# Patient Record
Sex: Male | Born: 1990 | Race: Black or African American | Hispanic: No | Marital: Single | Smoking: Current every day smoker
Health system: Southern US, Community
[De-identification: ages and names within clinical notes are randomized; demographics above are authoritative.]

## PROBLEM LIST (undated history)

## (undated) DIAGNOSIS — I82409 Acute embolism and thrombosis of unspecified deep veins of unspecified lower extremity: Secondary | ICD-10-CM

## (undated) DIAGNOSIS — D6859 Other primary thrombophilia: Secondary | ICD-10-CM

## (undated) HISTORY — PX: FRACTURE SURGERY: SHX138

## (undated) HISTORY — DX: Acute embolism and thrombosis of unspecified deep veins of unspecified lower extremity: I82.409

## (undated) HISTORY — PX: IVC FILTER INSERTION: CATH118245

## (undated) HISTORY — DX: Other primary thrombophilia: D68.59

## (undated) HISTORY — PX: IVC FILTER PLACEMENT (ARMC HX): HXRAD1551

---
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
## 2010-04-25 ENCOUNTER — Ambulatory Visit: Payer: Self-pay | Admitting: Family Medicine

## 2013-08-14 ENCOUNTER — Inpatient Hospital Stay: Payer: Self-pay | Admitting: Family Medicine

## 2013-08-14 LAB — CBC
HCT: 45.5 % (ref 40.0–52.0)
HGB: 14.4 g/dL (ref 13.0–18.0)
MCH: 22.9 pg — ABNORMAL LOW (ref 26.0–34.0)
MCHC: 31.7 g/dL — AB (ref 32.0–36.0)
MCV: 72 fL — ABNORMAL LOW (ref 80–100)
PLATELETS: 214 10*3/uL (ref 150–440)
RBC: 6.3 10*6/uL — AB (ref 4.40–5.90)
RDW: 13.6 % (ref 11.5–14.5)
WBC: 10.8 10*3/uL — ABNORMAL HIGH (ref 3.8–10.6)

## 2013-08-14 LAB — COMPREHENSIVE METABOLIC PANEL
ALBUMIN: 3.7 g/dL (ref 3.4–5.0)
ANION GAP: 3 — AB (ref 7–16)
Alkaline Phosphatase: 93 U/L
BUN: 9 mg/dL (ref 7–18)
Bilirubin,Total: 0.8 mg/dL (ref 0.2–1.0)
CREATININE: 0.81 mg/dL (ref 0.60–1.30)
Calcium, Total: 9.4 mg/dL (ref 8.5–10.1)
Chloride: 106 mmol/L (ref 98–107)
Co2: 27 mmol/L (ref 21–32)
GLUCOSE: 82 mg/dL (ref 65–99)
Osmolality: 270 (ref 275–301)
POTASSIUM: 5 mmol/L (ref 3.5–5.1)
SGOT(AST): 41 U/L — ABNORMAL HIGH (ref 15–37)
SGPT (ALT): 22 U/L (ref 12–78)
SODIUM: 136 mmol/L (ref 136–145)
TOTAL PROTEIN: 7.8 g/dL (ref 6.4–8.2)

## 2013-08-14 LAB — PROTIME-INR
INR: 1
Prothrombin Time: 13.2 secs (ref 11.5–14.7)

## 2013-08-14 LAB — APTT: Activated PTT: 32.6 secs (ref 23.6–35.9)

## 2013-08-15 LAB — APTT
ACTIVATED PTT: 79.8 s — AB (ref 23.6–35.9)
ACTIVATED PTT: 91 s — AB (ref 23.6–35.9)

## 2013-09-15 ENCOUNTER — Ambulatory Visit: Payer: Self-pay | Admitting: Hematology and Oncology

## 2013-11-20 ENCOUNTER — Ambulatory Visit: Payer: Self-pay | Admitting: Hematology and Oncology

## 2013-11-20 LAB — COMPREHENSIVE METABOLIC PANEL
ALT: 19 U/L
ANION GAP: 9 (ref 7–16)
Albumin: 3.9 g/dL (ref 3.4–5.0)
Alkaline Phosphatase: 87 U/L
BILIRUBIN TOTAL: 0.9 mg/dL (ref 0.2–1.0)
BUN: 11 mg/dL (ref 7–18)
CALCIUM: 9.4 mg/dL (ref 8.5–10.1)
CHLORIDE: 103 mmol/L (ref 98–107)
CO2: 29 mmol/L (ref 21–32)
CREATININE: 1.17 mg/dL (ref 0.60–1.30)
EGFR (African American): >60
EGFR (Non-African Amer.): >60
Glucose: 134 mg/dL — ABNORMAL HIGH (ref 65–99)
OSMOLALITY: 283 (ref 275–301)
POTASSIUM: 4 mmol/L (ref 3.5–5.1)
SGOT(AST): 15 U/L (ref 15–37)
SODIUM: 141 mmol/L (ref 136–145)
TOTAL PROTEIN: 7.6 g/dL (ref 6.4–8.2)

## 2013-11-20 LAB — CBC CANCER CENTER
Basophil #: 0 x10 3/mm (ref 0.0–0.1)
Basophil %: 0.3 %
EOS PCT: 1.9 %
Eosinophil #: 0.2 x10 3/mm (ref 0.0–0.7)
HCT: 47.5 % (ref 40.0–52.0)
HGB: 14.6 g/dL (ref 13.0–18.0)
LYMPHS ABS: 1.8 x10 3/mm (ref 1.0–3.6)
Lymphocyte %: 19.5 %
MCH: 22.6 pg — ABNORMAL LOW (ref 26.0–34.0)
MCHC: 30.7 g/dL — ABNORMAL LOW (ref 32.0–36.0)
MCV: 74 fL — AB (ref 80–100)
Monocyte #: 0.6 x10 3/mm (ref 0.2–1.0)
Monocyte %: 6.3 %
Neutrophil #: 6.7 x10 3/mm — ABNORMAL HIGH (ref 1.4–6.5)
Neutrophil %: 72 %
PLATELETS: 216 x10 3/mm (ref 150–440)
RBC: 6.45 10*6/uL — AB (ref 4.40–5.90)
RDW: 15.7 % — AB (ref 11.5–14.5)
WBC: 9.4 x10 3/mm (ref 3.8–10.6)

## 2013-11-25 ENCOUNTER — Ambulatory Visit: Payer: Self-pay | Admitting: Hematology and Oncology

## 2014-02-24 ENCOUNTER — Ambulatory Visit: Payer: Self-pay | Admitting: Hematology and Oncology

## 2014-03-27 ENCOUNTER — Ambulatory Visit: Payer: Self-pay | Admitting: Hematology and Oncology

## 2014-07-18 NOTE — H&P (Signed)
PATIENT NAME:  Jared David, Jared David MR#:  [id number] DATE OF BIRTH:  1990-11-18  DATE OF ADMISSION:  08/14/2013  PRIMARY CARE PHYSICIAN: None.   CHIEF COMPLAINT: Left leg swelling and pain for 3 to 4 days.   HOSPITAL COURSE: Mr. Lerew is a 24 year old African American gentleman with no past medical history who comes into the Emergency Room after he had complaints of increasing pain and swelling on the left lower extremity, swelling and pain. The patient came to the Emergency Room and was found to have acute large DVT starting from the femoral vein down to the calf veins. There is an area in the femoral vein where there is substantial thrombus which appears to be floating in the middle and distal portions of this vein. The patient is going to be started on IV heparin drip and is being admitted for further evaluation and management. The patient denies any recent illness. He is otherwise very active. Denies any other injury other than left ankle crush injury several years ago and had metal rods put in at that time. The patient is being admitted for further evaluation and management.   PAST MEDICAL HISTORY: None.   PAST SURGICAL HISTORY: Left ankle crush injury status post metal rod placement several years ago.   ALLERGIES: No known drug allergies.   FAMILY HISTORY: Positive for cancer in one of his aunts. The patient does not know what kind.   SOCIAL HISTORY: Smokes about 2 packs every week and drinks 3 to 4 times a week.  REVIEW OF SYSTEMS:  CONSTITUTIONAL: No fever, fatigue, weakness.  EYES: No blurred or double vision.  ENT: No tinnitus, ear pain, hearing loss. RESPIRATORY: No cough, wheeze, hemoptysis or COPD. CARDIOVASCULAR: No chest pain, orthopnea, edema or dyspnea on exertion. GASTROINTESTINAL: No nausea, vomiting, diarrhea, abdominal pain, or GERD.  GENITOURINARY: No dysuria, hematuria, or frequency.  ENDOCRINE: No polyuria, nocturia or thyroid problems.  HEMATOLOGY: No anemia, easy  bruising or bleeding.  SKIN: No acne or rash.  MUSCULOSKELETAL: Positive for left ankle pain.  NEUROLOGIC: No CVA, TIA, ataxia or dementia. ANXIETY: No anxiety or depression. All other systems reviewed and negative.   PHYSICAL EXAMINATION: GENERAL: The patient is awake, alert, and oriented x3, not in acute distress.  VITAL SIGNS: Afebrile. Pulse is 76. Blood pressure is 140/86 and sats are 94% on room air.  HEENT: Atraumatic, normocephalic. Pupils are equal, round and reactive to light and accommodation. EOM intact. Oral mucosa is moist.  NECK: Supple. No JVD. No carotid bruit.  LUNGS: Clear to auscultation bilaterally. No rales, rhonchi, respiratory distress or labored breathing.  CARDIOVASCULAR: Both heart sounds are normal. Rate and rhythm regular. PMI not lateralized. Chest nontender.  EXTREMITIES: Left lower extremity swelling, tenderness present at the calf. Good femoral pulses. Good pedal pulses. Trace lower extremity edema on the left. NEUROLOGIC: Grossly intact cranial nerves II through XII. No motor or sensory deficit. PSYCHIATRIC: The patient is awake, alert, and oriented x3.  SKIN: Warm and dry.   DIAGNOSTIC DATA: Basic metabolic panel within normal limits.   Ultrasound of the lower extremity shows large acute DVT involving the left gastrocnemius, peroneal, posterior tibial, popliteal and superficial femoral veins. It is concerning that the thrombus within the middle and distal portion of the left superficial femoral vein appears to be pedunculated and floating within the lumen of the vein, concerning for risk of breaking off and moving centrally.   PT-INR within normal limits. H and H 14.4 and 45.5.   ASSESSMENT AND  PLAN: A 24 year old, Jared David, with no significant past medical history who comes in with:  1.  Acute large deep venous thrombosis, left lower extremity with mobile thrombus in the middle distal part of the left superficial femoral vein. The patient is going to  be started on heparin drip after bolus. The case was discussed by ER physician with Dr. Gilda CreaseSchnier who plans on placing an IVC filter. Hypercoagulable panel has been sent out. We will consult oncology for acute deep vein thrombosis. The patient, once stable, can be switched to oral anticoagulation.  2.  Left leg pain. Treatment as above. P.r.n. pain medication.  3.  Deep vein thrombosis prophylaxis. The patient is already on IV heparin drip.  4.  Tobacco abuse. The patient was advised on smoking cessation, about 4 minutes spent. He did voice understanding.   Further work-up according to the patient's clinical course. Hospital admission plan was discussed with the patient.   TIME SPENT: 50 minutes.   ____________________________ Wylie HailSona A. Allena KatzPatel, MD sap:sb D: 08/14/2013 13:30:17 ET T: 08/14/2013 14:20:06 ET JOB#: [id number]  cc: Coston Mandato A. Allena KatzPatel, MD, <Dictator> Willow OraSONA A Gergory Biello MD ELECTRONICALLY SIGNED 08/17/2013 16:25

## 2014-07-18 NOTE — Consult Note (Signed)
Brief Consult Note: Diagnosis: LLE DVT s/p IVC filter placement 08/14/13.   Comments: Idiopathic DVT LLE- hypercoaguable w/u pending. Will need oral anticoagulation for 6 months total if no risk factor identified. S/p IVC filter. Full consult to follow.  Electronic Signatures: Antony Hasteamiah, Mishika Flippen S (MD)  (Signed 22-May-15 12:41)  Authored: Brief Consult Note   Last Updated: 22-May-15 12:41 by Antony Hasteamiah, Trae Bovenzi S (MD)

## 2014-07-18 NOTE — Op Note (Signed)
PATIENT NAME:  Acey LavWOODS, Z[place] C MR#:  [id number] DATE OF BIRTH:  1990/06/13  DATE OF PROCEDURE:  08/14/2013  PREOPERATIVE DIAGNOSIS: Deep venous thrombosis left lower extremity with mobile thrombus by duplex ultrasound.   POSTOPERATIVE DIAGNOSIS: Deep venous thrombosis left lower extremity with mobile thrombus by duplex ultrasound.   PROCEDURES PERFORMED: 1. Inferior venacavogram.  2. Placement of infrarenal inferior vena caval filter, Denali type.   SURGEON: Renford DillsGregory G. Hideko Esselman, MD   SEDATION: Versed 3 mg.   ACCESS: A 9-French sheath, right common femoral vein.   FLUOROSCOPY TIME: Less than one minute.   CONTRAST USED: Isovue 15 mL.   INDICATIONS: This patient is a 24 year old gentleman who presented to the Emergency Room with a very large painful, swollen left leg. Duplex ultrasound demonstrated an extensive mobile tail sitting within the femoral vein and it is elected to place an IVC filter in conjunction with his anticoagulation to prevent pulmonary embolism. Risks and benefits were reviewed. The patient has agreed to proceed. Discussion regarding filter removal and the indications and importance of filter removal were also reviewed at this time and the patient voiced understanding of this.   DESCRIPTION OF PROCEDURE: The patient is taken to special procedures and placed in the supine position. After adequate sedation has been achieved, the right groin is prepped and draped in a sterile fashion. Ultrasound is placed in a sterile sleeve.   Femoral vein is echolucent and compressible indicating patency. Image is recorded for the permanent record. Under real-time visualization after 1% lidocaine has been infiltrated;  micropuncture needle is used to access the femoral vein. Microwire followed by microsheath, J-wire followed by the 9.5 French delivery sheath. Delivery sheath is advanced under fluoroscopy to the level of the iliac confluence. AP projection of the vena cava is then obtained with  a bolus injection of contrast. After review of the images, the wire is reintroduced and the sheath is advanced so that the tip is at the L2 level. Denali filter is then deployed without difficulty. The sheath is pulled, pressure is held, and a safeguard device is placed. There are no immediate complications.   INTERPRETATION: Imaging of the cava demonstrates the cava is free of filling defects. Renal veins are localized actually a small amount of contrast is refluxed into the right and left renal veins at the L1 level. Vena cava itself measures 24.5 mm in diameter.   [place] Walk[place]ali filter is deployed at the L2 level without difficulty.   SUMMARY: Successful placement of infrarenal filter.   ____________________________ Renford DillsGregory G. Mouna Yager, MD ggs:dd D: 08/14/2013 18:00:06 ET T: 08/14/2013 21:25:59 ET JOB#: [id number]  cc: Renford DillsGregory G. Gusta Marksberry, MD, <Dictator> Renford DillsGREGORY G Emelin Dascenzo MD ELECTRONICALLY SIGNED 08/26/2013 12:28

## 2014-07-18 NOTE — Consult Note (Signed)
Present Illness The patient is a 24 year old gentleman who comes into the Emergency Room the complaint of increasing pain and swelling on the left lower extremity, swelling and pain. The patient was found to have acute large DVT starting from the femoral vein down to the calf veins. There is an area in the femoral vein where there is substantial thrombus which appears to be floating in the middle and distal portions of this vein. The patient was be started on IV heparin drip and is admitted for further evaluation and management. The patient denies any recent illness. He is otherwise very active. Denies any other injury other than left ankle crush injury several years ago and had metal rods put in at that time.   PAST MEDICAL HISTORY: None.   PAST SURGICAL HISTORY: Left ankle crush injury status post metal rod placement several years ago.   Home Medications: Medication Status  none Active    No Known Allergies:   Case History:  Family History Non-Contributory   Social History positive  tobacco, positive ETOH, negative Illicit drugs   Review of Systems:  Fever/Chills No   Cough No   Sputum No   Abdominal Pain No   Diarrhea No   Constipation No   Nausea/Vomiting No   SOB/DOE No   Chest Pain No   Telemetry Reviewed NSR   Dysuria No   Physical Exam:  GEN well developed, well nourished, no acute distress   HEENT PERRL, hearing intact to voice, moist oral mucosa   NECK supple  trachea midline   RESP normal resp effort  no use of accessory muscles   CARD regular rate  no JVD   ABD denies tenderness  soft   EXTR negative cyanosis/clubbing, positive edema   SKIN No rashes, No ulcers   NEURO cranial nerves intact, follows commands, motor/sensory function intact   PSYCH alert, A+O to time, place, person   Nursing/Ancillary Notes: **Vital Signs.:   21-May-15 14:30  Temperature Temperature (F) 98.2  Celsius 36.7  Temperature Source oral  Pulse Pulse 55   Respirations Respirations 16  Systolic BP Systolic BP 809  Diastolic BP (mmHg) Diastolic BP (mmHg) 77  Mean BP 93  Pulse Ox % Pulse Ox % 96  Pulse Ox Activity Level  At rest  Oxygen Delivery Room Air/ 21 %    15:10  Pulse Pulse 52  Respirations Respirations 18  Systolic BP Systolic BP 983  Diastolic BP (mmHg) Diastolic BP (mmHg) 63  Pulse Ox % Pulse Ox % 97   LabObservation:  21-May-15 12:27   OBSERVATION Reason for Test Pain  Hepatic:  21-May-15 12:31   Bilirubin, Total 0.8  Alkaline Phosphatase 93 (45-117 NOTE: New Reference Range 02/14/13)  SGPT (ALT) 22  SGOT (AST)  41  Total Protein, Serum 7.8  Albumin, Serum 3.7  Routine Chem:  21-May-15 12:31   Glucose, Serum 82  BUN 9  Creatinine (comp) 0.81  Sodium, Serum 136  Potassium, Serum 5.0  Chloride, Serum 106  CO2, Serum 27  Calcium (Total), Serum 9.4  Osmolality (calc) 270  eGFR (African American) >60  eGFR (Non-African American) >60 (eGFR values <63m/min/1.73 m2 may be an indication of chronic kidney disease (CKD). Calculated eGFR is useful in patients with stable renal function. The eGFR calculation will not be reliable in acutely ill patients when serum creatinine is changing rapidly. It is not useful in  patients on dialysis. The eGFR calculation may not be applicable to patients at the low and high  extremes of body sizes, pregnant women, and vegetarians.)  Anion Gap  3  Routine Coag:  21-May-15 12:31   Prothrombin 13.2  INR 1.0 (INR reference interval applies to patients on anticoagulant therapy. A single INR therapeutic range for coumarins is not optimal for all indications; however, the suggested range for most indications is 2.0 - 3.0. Exceptions to the INR Reference Range may include: Prosthetic heart valves, acute myocardial infarction, prevention of myocardial infarction, and combinations of aspirin and anticoagulant. The need for a higher or lower target INR must be assessed  individually. Reference: The Pharmacology and Management of the Vitamin K  antagonists: the seventh ACCP Conference on Antithrombotic and Thrombolytic Therapy. WTUUE.2800 Sept:126 (3suppl): N9146842. A HCT value >55% may artifactually increase the PT.  In one study,  the increase was an average of 25%. Reference:  "Effect on Routine and Special Coagulation Testing Values of Citrate Anticoagulant Adjustment in Patients with High HCT Values." American Journal of Clinical Pathology 2006;126:400-405.)  Activated PTT (APTT) 32.6 (A HCT value >55% may artifactually increase the APTT. In one study, the increase was an average of 19%. Reference: "Effect on Routine and Special Coagulation Testing Values of Citrate Anticoagulant Adjustment in Patients with High HCT Values." American Journal of Clinical Pathology 2006;126:400-405.)  Routine Hem:  21-May-15 12:31   WBC (CBC)  10.8  RBC (CBC)  6.30  Hemoglobin (CBC) 14.4  Hematocrit (CBC) 45.5  MCV  72  MCH  22.9  MCHC  31.7  RDW 13.6   [place]:    21-May-15 12:27, [place] Color Flow Doppler Low Extrem Left (Leg)  [place] Color Flow Doppler Low Extrem Left (Leg)   REASON FOR EXAM:    Pain  COMMENTS:   LMP: (Male)    PROCEDURE: [place]  - US DOPPLER LOW EXTR LEFT  - Aug 14 2013 12:27PM     CLINICAL DATA:  Left lower extremity pain and swelling.    EXAM:  Left LOWER EXTREMITY VENOUS DOPPLER ULTRASOUND    TECHNIQUE:  Gray-scale sonography with graded compression, as well as color  Doppler and duplex ultrasound were performed to evaluate the lower  extremity deep venous systems from the level of the common femoral  vein and including the common femoral, femoral, profunda femoral,  popliteal and calf veins including the posterior tibial, peroneal  and gastrocnemius veins when visible. The superficial great  saphenous vein was also interrogated. Spectral Doppler was utilized  to evaluate flow at rest and with distal augmentation maneuvers in  the common  femoral, femoral and popliteal veins.    COMPARISON:  None.    FINDINGS:  Common Femoral Vein: No evidence of thrombus. Normal compressibility  and respiratory phasicity. Augmentation was not performed.    Saphenofemoral Junction: No evidence of thrombus. Normal  compressibility and flow on color Doppler imaging.  Profunda Femoral Vein: No evidence of thrombus. Normal  compressibility and flow on color Doppler imaging.    Femoral Vein: There is a substantial thrombus which appears to be  floating within the middle and distal portions of this vein.    Popliteal Vein: Acute thrombosis is noted without compressibility or  phasicity.    Calf Veins: Acute thrombosis is noted in posterior tibial and  peroneal veins without compressibility or phases to the.    Superficial Great Saphenous Vein: No evidence of thrombus. Normal  compressibility and flow on color Doppler imaging.  Venous Reflux:  None.    Other Findings:  Thrombus is noted within the gastrocnemius vein.  IMPRESSION:  Large acute deep venous thrombosis seen involving the left  gastrocnemius, peroneal, posterior tibial, popliteal and superficial  femoral veins. It is concerning that the thrombus within the middle  and distal portion of the left superficial femoral vein appears to  be pedunculated and floating within the lumen of the vein,  concerning for increased risk of breaking off and moving centrally.  Critical Value/emergent results were called by telephone at the time  of interpretation on 08/14/2013 at 12:45 PM to Dr. Allena Earing, who  verbally acknowledged these results.  Electronically Signed    By: Sabino Dick M.D.    On: 08/14/2013 12:46         Verified By: Marveen Reeks, M.D.,    Impression 1.  DVT left leg  given the mobile portion of the thrombus he should be started on heparin adn I also recommend that a filter be placed to prevent leathal PE.  Anticoagulation will be continued for 6-8  months I have discussed with the patient that the filter should be removed in about 4 months and he voices understanding.  The risks and benefits were discussed all questions were answered alternatives were reviewed.  The patietn agrees to proceed with filter placement. 2.  Left leg pain. Treatment as above. P.r.n. pain medication.  3.  Tobacco abuse. The patient was advised on smoking cessation, about 4 minutes spent. He did voice understanding.   Plan level 3 consult   Electronic Signatures: Hortencia Pilar (MD)  (Signed 21-May-15 17:35)  Authored: General Aspect/Present Illness, Home Medications, Allergies, History and Physical Exam, Vital Signs, Labs, Radiology, Impression/Plan   Last Updated: 21-May-15 17:35 by Hortencia Pilar (MD)

## 2014-07-18 NOTE — Discharge Summary (Signed)
PATIENT NAME:  Jared David, Egbert C MR#:  [id number] DATE OF BIRTH:  1990-12-22  DATE OF ADMISSION:  08/14/2013 DATE OF DISCHARGE:  08/15/2013  PROCEDURES:  1. IVC filter placement due to deep vein thrombosis of the left lower extremity with mobile thrombosis by duplex ultrasound. Surgeon: Dr. Levora DredgeGregory Schnier.  2. Denali filter deployed at the level of L2 without any difficulty.   Filter okay to be removed in 4 months.   CONSULTANTS: Dr. Shyrl NumbersVeshana Ramiah, Dr. Levora DredgeGregory Schnier.    DISPOSITION: Home.   MEDICATION AT DISCHARGE: Xarelto 15 mg p.o. twice daily for 21 days, then change to 20 mg p.o. once daily for a total of 6 months.   FOLLOWUP:  1. Dr. Shyrl NumbersVeshana Ramiah for hypercoagulable workup results.  2. Follow up Dr. Levora DredgeGregory Schnier for removal of filter in 4 months.   LABORATORY WORK: Glucose 82, BUN 9, creatinine 0.81. Other electrolytes were within normal limits. AST 41. White count is 10.8. Hemoglobin is 14. Platelet count 214. INR 1.0.   TESTS TO BE COMPLETED: Factor II mutation analysis, factor V Leiden mutation, protein C and protein S panel, protein C deficiency profile.   All of this has been sent and will be followed up as outpatient.   RADIOLOGY: Ultrasound of the left lower extremity shows large acute deep vein thrombosis seen involving the left gastrocnemius, peroneal, posterior tibial, popliteal and superficial femoral veins, concerning for thrombus within the middle and distal portion of the left superficial femoral vein. Appears to be pedunculated and floating within the lumen of the vein. Concerning for increased risk of breaking off and moving centrally.   HOSPITAL COURSE: This is a very nice 24 year old gentleman who has a history of being healthy, admitted on 08/14/2013 by Dr. Enedina FinnerSona Patel with a history of left leg swelling and pain for 3 or 4 days. The patient went to the Emergency Department. He was evaluated. Swelling was evident. Ultrasound of the lower extremity was done  showing an acute DVT as mentioned above. The patient was started on IV heparin, and vascular surgeon was called for IVC filter placement. Placement of the IVC filter was done by Dr. Gilda CreaseSchnier successfully without any problems. The patient had previous ankle injury several years ago with metal rods at the time in the left lower extremity, but otherwise he denies any significant risk factors for DVT. The patient is active. Not sedentary. No recent trauma. No known cancer or other problems. He states that his grandfather had similar problems of DVT. The patient was admitted, treated with heparin. Heparin was changed to Xarelto, and the patient was able to be discharged in good condition. He was evaluated by Dr. Wendie Simmeramiah who is going to follow up as outpatient for hypercoagulable workup and Dr. Gilda CreaseSchnier who is going to follow up for removal of IVC filter.   TIME SPENT: I spent about 40 minutes discharging this patient, education about the use of Xarelto and avoidance of trauma, contact sports, heavy machinery, etc. has been given to the patient. The patient is a smoker. Smoking cessation counseling given to the patient for over 4 minutes.   ____________________________ Felipa Furnaceoberto Sanchez Gutierrez, MD rsg:gb D: 08/15/2013 13:40:39 ET T: 08/15/2013 21:53:11 ET JOB#: [id number]  cc: Felipa Furnaceoberto Sanchez Gutierrez, MD, <Dictator> Tasnim Balentine Juanda ChanceSANCHEZ GUTIERRE MD ELECTRONICALLY SIGNED 08/30/2013 10:49

## 2015-05-10 ENCOUNTER — Inpatient Hospital Stay: Payer: Self-pay | Attending: Internal Medicine | Admitting: Internal Medicine

## 2015-05-10 ENCOUNTER — Inpatient Hospital Stay: Payer: Self-pay

## 2015-05-10 ENCOUNTER — Encounter: Payer: Self-pay | Admitting: Internal Medicine

## 2015-05-10 VITALS — BP 127/84 | HR 81 | Temp 96.7°F | Resp 18 | Ht 71.0 in | Wt 156.2 lb

## 2015-05-10 DIAGNOSIS — I82402 Acute embolism and thrombosis of unspecified deep veins of left lower extremity: Secondary | ICD-10-CM

## 2015-05-10 DIAGNOSIS — D6859 Other primary thrombophilia: Secondary | ICD-10-CM | POA: Insufficient documentation

## 2015-05-10 DIAGNOSIS — Z86718 Personal history of other venous thrombosis and embolism: Secondary | ICD-10-CM | POA: Insufficient documentation

## 2015-05-10 DIAGNOSIS — F17210 Nicotine dependence, cigarettes, uncomplicated: Secondary | ICD-10-CM | POA: Insufficient documentation

## 2015-05-10 LAB — FIBRIN DERIVATIVES D-DIMER (ARMC ONLY): FIBRIN DERIVATIVES D-DIMER (ARMC): 331 (ref 0–499)
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
## 2015-05-10 NOTE — Progress Notes (Signed)
Pt reports having blood clot in May 2015 and had a filter placed.  Pt took Xarelto from May 2015 to March 2016.   No bleeding concerns.  Pt reporting that right wrist has pain in it after cutting wood.

## 2015-05-11 LAB — PROTEIN C ACTIVITY: Protein C Activity: 48 % — ABNORMAL LOW (ref 73–180)

## 2015-05-11 LAB — PROTEIN C, TOTAL: Protein C, Total: 43 % — ABNORMAL LOW (ref 60–150)

## 2015-05-13 ENCOUNTER — Ambulatory Visit
Admission: RE | Admit: 2015-05-13 | Discharge: 2015-05-13 | Disposition: A | Discharge status: Home / Self Care | Payer: Self-pay | Source: Ambulatory Visit | Attending: Internal Medicine | Admitting: Internal Medicine

## 2015-05-13 DIAGNOSIS — I82402 Acute embolism and thrombosis of unspecified deep veins of left lower extremity: Secondary | ICD-10-CM | POA: Insufficient documentation

## 2015-05-13 DIAGNOSIS — Z86718 Personal history of other venous thrombosis and embolism: Secondary | ICD-10-CM | POA: Insufficient documentation

## 2015-05-31 ENCOUNTER — Inpatient Hospital Stay: Payer: Self-pay | Attending: Family Medicine | Admitting: Family Medicine

## 2015-05-31 ENCOUNTER — Encounter: Payer: Self-pay | Admitting: Family Medicine

## 2015-05-31 VITALS — BP 144/76 | HR 78 | Temp 96.9°F | Wt 161.0 lb

## 2015-05-31 DIAGNOSIS — F17210 Nicotine dependence, cigarettes, uncomplicated: Secondary | ICD-10-CM | POA: Insufficient documentation

## 2015-05-31 DIAGNOSIS — Z7289 Other problems related to lifestyle: Secondary | ICD-10-CM | POA: Insufficient documentation

## 2015-05-31 DIAGNOSIS — Z86718 Personal history of other venous thrombosis and embolism: Secondary | ICD-10-CM | POA: Insufficient documentation

## 2015-05-31 DIAGNOSIS — D6859 Other primary thrombophilia: Secondary | ICD-10-CM | POA: Insufficient documentation

## 2015-05-31 DIAGNOSIS — I82409 Acute embolism and thrombosis of unspecified deep veins of unspecified lower extremity: Secondary | ICD-10-CM

## 2015-05-31 HISTORY — DX: Acute embolism and thrombosis of unspecified deep veins of unspecified lower extremity: I82.409

## 2015-05-31 HISTORY — DX: Other primary thrombophilia: D68.59
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
## 2015-06-28 ENCOUNTER — Inpatient Hospital Stay (HOSPITAL_BASED_OUTPATIENT_CLINIC_OR_DEPARTMENT_OTHER): Payer: Self-pay | Admitting: Internal Medicine

## 2015-06-28 ENCOUNTER — Inpatient Hospital Stay: Payer: Self-pay | Attending: Internal Medicine

## 2015-06-28 ENCOUNTER — Encounter: Payer: Self-pay | Admitting: Internal Medicine

## 2015-06-28 VITALS — BP 129/73 | HR 65 | Temp 98.0°F | Resp 18 | Wt 169.5 lb

## 2015-06-28 DIAGNOSIS — I82402 Acute embolism and thrombosis of unspecified deep veins of left lower extremity: Secondary | ICD-10-CM

## 2015-06-28 DIAGNOSIS — Z86718 Personal history of other venous thrombosis and embolism: Secondary | ICD-10-CM | POA: Insufficient documentation

## 2015-06-28 DIAGNOSIS — D6859 Other primary thrombophilia: Secondary | ICD-10-CM

## 2015-06-28 DIAGNOSIS — F17210 Nicotine dependence, cigarettes, uncomplicated: Secondary | ICD-10-CM | POA: Insufficient documentation

## 2015-06-28 LAB — PROTIME-INR
INR: 1.03
Prothrombin Time: 13.7 seconds (ref 11.4–15.0)

## 2015-06-28 NOTE — Patient Instructions (Signed)
Smoking Cessation, Tips for Success If you are ready to quit smoking, congratulations! You have chosen to help yourself be healthier. Cigarettes bring nicotine, tar, carbon monoxide, and other irritants into your body. Your lungs, heart, and blood vessels will be able to work better without these poisons. There are many different ways to quit smoking. Nicotine gum, nicotine patches, a nicotine inhaler, or nicotine nasal spray can help with physical craving. Hypnosis, support groups, and medicines help break the habit of smoking. WHAT THINGS CAN I DO TO MAKE QUITTING EASIER?  Here are some tips to help you quit for good:  Pick a date when you will quit smoking completely. Tell all of your friends and family about your plan to quit on that date.  Do not try to slowly cut down on the number of cigarettes you are smoking. Pick a quit date and quit smoking completely starting on that day.  Throw away all cigarettes.   Clean and remove all ashtrays from your home, work, and car.  On a card, write down your reasons for quitting. Carry the card with you and read it when you get the urge to smoke.  Cleanse your body of nicotine. Drink enough water and fluids to keep your urine clear or pale yellow. Do this after quitting to flush the nicotine from your body.  Learn to predict your moods. Do not let a bad situation be your excuse to have a cigarette. Some situations in your life might tempt you into wanting a cigarette.  Never have "just one" cigarette. It leads to wanting another and another. Remind yourself of your decision to quit.  Change habits associated with smoking. If you smoked while driving or when feeling stressed, try other activities to replace smoking. Stand up when drinking your coffee. Brush your teeth after eating. Sit in a different chair when you read the paper. Avoid alcohol while trying to quit, and try to drink fewer caffeinated beverages. Alcohol and caffeine may urge you to  smoke.  Avoid foods and drinks that can trigger a desire to smoke, such as sugary or spicy foods and alcohol.  Ask people who smoke not to smoke around you.  Have something planned to do right after eating or having a cup of coffee. For example, plan to take a walk or exercise.  Try a relaxation exercise to calm you down and decrease your stress. Remember, you may be tense and nervous for the first 2 weeks after you quit, but this will pass.  Find new activities to keep your hands busy. Play with a pen, coin, or rubber band. Doodle or draw things on paper.  Brush your teeth right after eating. This will help cut down on the craving for the taste of tobacco after meals. You can also try mouthwash.   Use oral substitutes in place of cigarettes. Try using lemon drops, carrots, cinnamon sticks, or chewing gum. Keep them handy so they are available when you have the urge to smoke.  When you have the urge to smoke, try deep breathing.  Designate your home as a nonsmoking area.  If you are a heavy smoker, ask your health care provider about a prescription for nicotine chewing gum. It can ease your withdrawal from nicotine.  Reward yourself. Set aside the cigarette money you save and buy yourself something nice.  Look for support from others. Join a support group or smoking cessation program. Ask someone at home or at work to help you with your plan   to quit smoking.  Always ask yourself, "Do I need this cigarette or is this just a reflex?" Tell yourself, "Today, I choose not to smoke," or "I do not want to smoke." You are reminding yourself of your decision to quit.  Do not replace cigarette smoking with electronic cigarettes (commonly called e-cigarettes). The safety of e-cigarettes is unknown, and some may contain harmful chemicals.  If you relapse, do not give up! Plan ahead and think about what you will do the next time you get the urge to smoke. HOW WILL I FEEL WHEN I QUIT SMOKING? You  may have symptoms of withdrawal because your body is used to nicotine (the addictive substance in cigarettes). You may crave cigarettes, be irritable, feel very hungry, cough often, get headaches, or have difficulty concentrating. The withdrawal symptoms are only temporary. They are strongest when you first quit but will go away within 10-14 days. When withdrawal symptoms occur, stay in control. Think about your reasons for quitting. Remind yourself that these are signs that your body is healing and getting used to being without cigarettes. Remember that withdrawal symptoms are easier to treat than the major diseases that smoking can cause.  Even after the withdrawal is over, expect periodic urges to smoke. However, these cravings are generally short lived and will go away whether you smoke or not. Do not smoke! WHAT RESOURCES ARE AVAILABLE TO HELP ME QUIT SMOKING? Your health care provider can direct you to community resources or hospitals for support, which may include:  Group support.  Education.  Hypnosis.  Therapy.   This information is not intended to replace advice given to you by your health care provider. Make sure you discuss any questions you have with your health care provider.   Document Released: 12/10/2003 Document Revised: 04/03/2014 Document Reviewed: 08/29/2012 Elsevier Interactive Patient Education 2016 [employer]  

## 2015-06-28 NOTE — Progress Notes (Signed)
[facility] OFFICE PROGRESS NOTE  Patient Care Team: Alba CoryKrichna Sowles, MD as PCP - General (Family Medicine) No Pcp Per Patient (General Practice)   SUMMARY OF HEMATOLOGIC HISTORY:  # MAY 2015- DVT of Left LE; s/p IVC filter [Mobile Blood clot]; on anticoag x1 year; stopped sec to incarceration. 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 I reviewed the patient's prior charts/pertinent labs; findings are summarized above.   25 year old African-American male patient with a history of DVT - is currently here for follow-up/regarding recommendations of anti- coagulation. Patient denies any new blood clots. Denies any unusual shortness of breath or cough.  He smokes.    REVIEW O .F SYSTEMS:  A complete 10 point review of system is done which is negative except mentioned above/history of present illness.   PAST MEDICAL HISTORY :  Past Medical History  Diagnosis Date  . DVT (deep venous thrombosis) (HCC)   . 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 DVT (deep venous thrombosis) (HCC) 05/31/2015    PAST SURGICAL HISTORY :   Past Surgical History  Procedure Laterality Date  . Ivc filter placement ([facility] hx)      FAMILY HISTORY :  No family history on file.grandfather had ? Blood clots.   SOCIAL HISTORY:   Social History  Substance Use Topics  . Smoking status: Current Every Day Smoker -- 0.25 packs/day for 6 years    Types: Cigarettes  . Smokeless tobacco: Never Used  . Alcohol Use: 6.0 oz/week    10 Shots of liquor per week    ALLERGIES:  has No Known Allergies.  MEDICATIONS:  No current outpatient prescriptions on file.   No current facility-administered medications for this visit.    PHYSICAL EXAMINATION:   BP 129/73 mmHg  Pulse 65  Temp(Src) 98 F (36.7 C) (Tympanic)  Resp 18  Wt 169 lb 8.5  oz (76.9 kg)  Filed Weights   06/28/15 1527  Weight: 169 lb 8.5 oz (76.9 kg)    GENERAL: Well-nourished well-developed; Alert, no distress and comfortable.   Alone. Tattoos EYES: no pallor or icterus OROPHARYNX: no thrush or ulceration; good dentition  NECK: supple, no masses felt LYMPH:  no palpable lymphadenopathy in the cervical, axillary or inguinal regions LUNGS: clear to auscultation and  No wheeze or crackles HEART/CVS: regular rate & rhythm and no murmurs; No lower extremity edema ABDOMEN:abdomen soft, non-tender and normal bowel sounds Musculoskeletal:no cyanosis of digits and no clubbing  PSYCH: alert & oriented x 3 with fluent speech; flat affect NEURO: no focal motor/sensory deficits SKIN:  no rashes or significant lesions  LABORATORY DATA:  I have reviewed the data as listed    Component Value Date/Time   NA 141 11/20/2013 1051   K 4.0 11/20/2013 1051   CL 103 11/20/2013 1051   CO2 29 11/20/2013 1051   GLUCOSE 134* 11/20/2013 1051   BUN 11 11/20/2013 1051   CREATININE 1.17 11/20/2013 1051   CALCIUM 9.4 11/20/2013 1051   PROT 7.6 11/20/2013 1051   ALBUMIN 3.9 11/20/2013 1051   AST 15 11/20/2013 1051   ALT 19 11/20/2013 1051   ALKPHOS 87 11/20/2013 1051   BILITOT 0.9 11/20/2013 1051   GFRNONAA >60 11/20/2013 1051   GFRAA >60 11/20/2013 1051    No results found for: SPEP, UPEP  Lab Results  Component Value Date   WBC 9.4 11/20/2013   NEUTROABS 6.7* 11/20/2013   HGB 14.6 11/20/2013   HCT 47.5 11/20/2013   MCV 74* 11/20/2013   PLT 216 11/20/2013      Chemistry      Component Value Date/Time   NA 141 11/20/2013 1051   K 4.0 11/20/2013 1051   CL 103 11/20/2013 1051   CO2 29 11/20/2013 1051   BUN 11 11/20/2013 1051   CREATININE 1.17 11/20/2013 1051      Component Value Date/Time   CALCIUM 9.4 11/20/2013 1051   ALKPHOS 87 11/20/2013 1051   AST 15 11/20/2013 1051   ALT 19 11/20/2013 1051   BILITOT 0.9 11/20/2013 1051        ASSESSMENT &  PLAN:   # Protein C deficiency; question provoked DVT of the left lower extremity. Recommend quitting smoking. I would recommend aspirin 325 mg a day. Discussed the risk of repeated blood clots with protein C deficiency- however committing this patient to long-term anticoagulation Is also putting the patient at risk for bleeding tendencies. I think given on previous question provoked DVT- I would reserve lifelong anticoagulation for repeated blood clot  # I reviewed the general precautions regarding DVT/PE prophylaxis.  # Retrieval of the IVC filter- and ideal situation the IVC filter should be taken out if the patient is on anticoagulation. However, I'm concerned about compliance/ and the fact that we're holding anticoagulation at this time I recommend keeping the filter in for now.   # Patient could follow up in 6 months with the nurse practitioner.  # 15 minutes face-to-face with the patient discussing the above plan of care; more than 50% of time spent on natural history; counseling and coordination.       Earna Coder, MD 06/28/2015 3:41 PM

## 2015-09-25 ENCOUNTER — Ambulatory Visit (INDEPENDENT_AMBULATORY_CARE_PROVIDER_SITE_OTHER): Payer: Self-pay

## 2015-09-25 ENCOUNTER — Encounter: Payer: Self-pay | Admitting: Gynecology

## 2015-09-25 ENCOUNTER — Ambulatory Visit
Admission: EM | Admit: 2015-09-25 | Discharge: 2015-09-25 | Disposition: A | Discharge status: Home / Self Care | Payer: Self-pay | Attending: Family Medicine | Admitting: Family Medicine

## 2015-09-25 DIAGNOSIS — S0083XA Contusion of other part of head, initial encounter: Secondary | ICD-10-CM

## 2015-09-25 DIAGNOSIS — S60012A Contusion of left thumb without damage to nail, initial encounter: Secondary | ICD-10-CM

## 2015-09-25 DIAGNOSIS — S63602A Unspecified sprain of left thumb, initial encounter: Secondary | ICD-10-CM

## 2015-09-25 MED ORDER — ACETAMINOPHEN 500 MG PO TABS: 1000.0000 mg | ORAL_TABLET | Freq: Once | ORAL | Status: DC

## 2015-09-25 MED ORDER — HYDROCODONE-ACETAMINOPHEN 5-325 MG PO TABS: ORAL_TABLET | ORAL | Status: DC

## 2015-09-25 MED ORDER — ACETAMINOPHEN 500 MG PO TABS
1000.0000 mg | ORAL_TABLET | Freq: Four times a day (QID) | ORAL | Status: DC | PRN
  Administered 2015-09-25: 1000 mg via ORAL

## 2015-09-25 NOTE — Discharge Instructions (Signed)
Facial or Scalp Contusion A facial or scalp contusion is a deep bruise on the face or head. Injuries to the face and head generally cause a lot of swelling, especially around the eyes. Contusions are the result of an injury that caused bleeding under the skin. The contusion may turn blue, purple, or yellow. Minor injuries will give you a painless contusion, but more severe contusions may stay painful and swollen for a few weeks.  CAUSES  A facial or scalp contusion is caused by a blunt injury or trauma to the face or head area.  SIGNS AND SYMPTOMS   Swelling of the injured area.   Discoloration of the injured area.   Tenderness, soreness, or pain in the injured area.  DIAGNOSIS  The diagnosis can be made by taking a medical history and doing a physical exam. An X-ray exam, CT scan, or MRI may be needed to determine if there are any associated injuries, such as broken bones (fractures). TREATMENT  Often, the best treatment for a facial or scalp contusion is applying cold compresses to the injured area. Over-the-counter medicines may also be recommended for pain control.  HOME CARE INSTRUCTIONS   Only take over-the-counter or prescription medicines as directed by your health care provider.   Apply ice to the injured area.   Put ice in a plastic bag.   Place a towel between your skin and the bag.   Leave the ice on for 20 minutes, 2-3 times a day.  SEEK MEDICAL CARE IF:  You have bite problems.   You have pain with chewing.   You are concerned about facial defects. SEEK IMMEDIATE MEDICAL CARE IF:  You have severe pain or a headache that is not relieved by medicine.   You have unusual sleepiness, confusion, or personality changes.   You throw up (vomit).   You have a persistent nosebleed.   You have double vision or blurred vision.   You have fluid drainage from your nose or ear.   You have difficulty walking or using your arms or legs.  MAKE SURE YOU:    Understand these instructions.  Will watch your condition.  Will get help right away if you are not doing well or get worse.   This information is not intended to replace advice given to you by your health care provider. Make sure you discuss any questions you have with your health care provider.   Document Released: 04/20/2004 Document Revised: 04/03/2014 Document Reviewed: 10/24/2012 Elsevier Interactive Patient Education 2016 Elsevier Inc. Thumb Sprain A thumb sprain is an injury to one of the strong bands of tissue (ligaments) that connect the bones in your thumb. The ligament can be stretched too much or it can tear. A tear can be either partial or complete. The severity of the sprain depends on how much of the ligament was damaged or torn. CAUSES A thumb sprain is often caused by a fall or an accident. If you extend your hands to catch an object or to protect yourself, the force of the impact can cause your ligament to stretch too much. This excess tension can also cause your ligament to tear. RISK FACTORS This injury is more likely to occur in people who play:  Sports that involve a greater risk of falling, such as skiing.  Sports that involve catching an object, such as basketball. SYMPTOMS Symptoms of this condition include:  Loss of motion in your thumb.  Bruising.  Tenderness.  Swelling. DIAGNOSIS This condition is diagnosed  with a medical history and physical exam. You may also have an X-ray of your thumb. TREATMENT Treatment varies depending on the severity of your sprain. If your ligament is overstretched or partially torn, treatment usually involves keeping your thumb in a fixed position (immobilization) for a period of time. To help you do this, your health care provider will apply a bandage, cast, or splint to keep your thumb from moving until it heals. If your ligament is fully torn, you may need surgery to reconnect the ligament to the bone. After surgery, a  cast or splint will be applied and will need to stay on your thumb while it heals. Your health care provider may also suggest exercises or physical therapy to strengthen your thumb. HOME CARE INSTRUCTIONS If You Have a Cast:  Do not stick anything inside the cast to scratch your skin. Doing that increases your risk of infection.  Check the skin around the cast every day. Report any concerns to your health care provider. You may put lotion on dry skin around the edges of the cast. Do not apply lotion to the skin underneath the cast.  Keep the cast clean and dry. If You Have a Splint:  Wear it as directed by your health care provider. Remove it only as directed by your health care provider.  Loosen the splint if your fingers become numb and tingle, or if they turn cold and blue.  Keep the splint clean and dry. Bathing  Cover the bandage, cast, or splint with a watertight plastic bag to protect it from water while you take a bath or a shower. Do not let the bandage, cast, or splint get wet. Managing Pain, Stiffness, and Swelling   If directed, apply ice to the injured area (unless you have a cast):  Put ice in a plastic bag.  Place a towel between your skin and the bag.  Leave the ice on for 20 minutes, 2-3 times per day.  Move your fingers often to avoid stiffness and to lessen swelling.  Raise (elevate) the injured area above the level of your heart while you are sitting or lying down. Driving  Do not drive or operate heavy machinery while taking pain medicine.  Do not drive while wearing a cast or splint on a hand that you use for driving. General Instructions  Do not put pressure on any part of your cast or splint until it is fully hardened. This may take several hours.  Take medicines only as directed by your health care provider. These include over-the-counter medicines and prescription medicines.  Keep all follow-up visits as directed by your health care provider. This  is important.  Do any exercise or physical therapy as directed by your health care provider.  Do not wear rings on your injured thumb. SEEK MEDICAL CARE IF:  Your pain is not controlled with medicine.  Your bruising or swelling gets worse.  Your cast or splint is damaged. SEEK IMMEDIATE MEDICAL CARE IF:  Your thumb is numb or blue.  Your thumb feels colder than normal.   This information is not intended to replace advice given to you by your health care provider. Make sure you discuss any questions you have with your health care provider.   Document Released: 04/20/2004 Document Revised: 07/28/2014 Document Reviewed: 12/23/2013 Elsevier Interactive Patient Education [employer] Elsevier Inc.
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
## 2015-12-27 ENCOUNTER — Inpatient Hospital Stay: Payer: Self-pay

## 2016-01-05 ENCOUNTER — Encounter: Payer: Self-pay | Admitting: Hematology and Oncology

## 2016-01-05 ENCOUNTER — Ambulatory Visit: Payer: Self-pay

## 2016-01-05 ENCOUNTER — Inpatient Hospital Stay: Payer: Self-pay | Attending: Hematology and Oncology | Admitting: Hematology and Oncology

## 2016-01-05 ENCOUNTER — Encounter (INDEPENDENT_AMBULATORY_CARE_PROVIDER_SITE_OTHER): Payer: Self-pay

## 2016-01-05 DIAGNOSIS — Z7982 Long term (current) use of aspirin: Secondary | ICD-10-CM | POA: Insufficient documentation

## 2016-01-05 DIAGNOSIS — Z95828 Presence of other vascular implants and grafts: Secondary | ICD-10-CM | POA: Insufficient documentation

## 2016-01-05 DIAGNOSIS — D6859 Other primary thrombophilia: Secondary | ICD-10-CM | POA: Insufficient documentation

## 2016-01-05 DIAGNOSIS — Z86718 Personal history of other venous thrombosis and embolism: Secondary | ICD-10-CM | POA: Insufficient documentation

## 2016-01-05 DIAGNOSIS — Z720 Tobacco use: Secondary | ICD-10-CM | POA: Insufficient documentation

## 2016-01-05 DIAGNOSIS — F17210 Nicotine dependence, cigarettes, uncomplicated: Secondary | ICD-10-CM | POA: Insufficient documentation
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
## 2016-01-05 NOTE — Assessment & Plan Note (Signed)
I spent some time counseling the patient the importance of tobacco cessation. he is currently attempting to quit on his own 

## 2016-01-05 NOTE — Assessment & Plan Note (Signed)
I reviewed with the patient about the plan for care for provoked DVT on background history of protein C deficiency and presence of irretrievable IVC filter  This last episode of blood clot appeared to be provoked. He has completed anticoagulation therapy and is currently on aspirin. He needs to take aspirin for life to prevent recurrence of DVT due to presence of IVC filter.  I think it is a reasoable alternative given history of non-compliance and costs of NOAC which is prohibitive  Another main issue we discussed today included the role of screening other family members for thrombophilia disorder. At present time, I would not recommend testing the patient's family members as it would not benefit them.  Thrombophilia disorder is a genetic predisposition which increases an individual's risk for a thrombotic event, NOT a disease.  We discussed the implications of genetic screening including the possibility of uninsurability, costs involved, emotional distress and possible discrimination at various levels for the affected individual.  Rather than genetic screening, one can possibly benefit from genetic counseling or dissemination of appropriate reading materials to educate other family members.  Finally, at the end of our consultation today, I reinforced the importance of preventive strategies such as avoiding hormonal supplement, avoiding cigarette smoking, keeping up-to-date with screening programs for early cancer detection, frequent ambulation for long distance travel and aggressive DVT prophylaxis in all surgical settings.  I have not made a return appointment for the patient to come back. I would be happy to assist in perioperative DVT management in the future should he need any interruption of his anticoagulation therapy for elective procedures.

## 2016-01-05 NOTE — Assessment & Plan Note (Signed)
I recommend long term aspirin therapy The IVC filter is considered irretrievable at this point We discussed risks of bleeding while on aspirin

## 2016-01-06 NOTE — Progress Notes (Signed)
Is he still my patient? Not seen since 2012

## 2016-10-09 ENCOUNTER — Inpatient Hospital Stay (HOSPITAL_COMMUNITY): Payer: Self-pay

## 2016-10-09 ENCOUNTER — Inpatient Hospital Stay (HOSPITAL_COMMUNITY)
Admission: EM | Admit: 2016-10-09 | Discharge: 2016-10-12 | DRG: 083 | Disposition: A | Discharge status: Home / Self Care | Payer: Self-pay | Attending: General Surgery | Admitting: General Surgery

## 2016-10-09 ENCOUNTER — Encounter (HOSPITAL_COMMUNITY): Payer: Self-pay | Admitting: Neurological Surgery

## 2016-10-09 ENCOUNTER — Emergency Department (HOSPITAL_COMMUNITY): Payer: Self-pay

## 2016-10-09 DIAGNOSIS — Z23 Encounter for immunization: Secondary | ICD-10-CM

## 2016-10-09 DIAGNOSIS — F10120 Alcohol abuse with intoxication, uncomplicated: Secondary | ICD-10-CM | POA: Diagnosis present

## 2016-10-09 DIAGNOSIS — F10920 Alcohol use, unspecified with intoxication, uncomplicated: Secondary | ICD-10-CM

## 2016-10-09 DIAGNOSIS — Y9289 Other specified places as the place of occurrence of the external cause: Secondary | ICD-10-CM

## 2016-10-09 DIAGNOSIS — S065X9A Traumatic subdural hemorrhage with loss of consciousness of unspecified duration, initial encounter: Secondary | ICD-10-CM | POA: Diagnosis present

## 2016-10-09 DIAGNOSIS — A749 Chlamydial infection, unspecified: Secondary | ICD-10-CM | POA: Diagnosis present

## 2016-10-09 DIAGNOSIS — I959 Hypotension, unspecified: Secondary | ICD-10-CM | POA: Diagnosis present

## 2016-10-09 DIAGNOSIS — S0181XA Laceration without foreign body of other part of head, initial encounter: Secondary | ICD-10-CM

## 2016-10-09 DIAGNOSIS — Z86718 Personal history of other venous thrombosis and embolism: Secondary | ICD-10-CM

## 2016-10-09 DIAGNOSIS — G9389 Other specified disorders of brain: Secondary | ICD-10-CM

## 2016-10-09 DIAGNOSIS — S0291XB Unspecified fracture of skull, initial encounter for open fracture: Secondary | ICD-10-CM

## 2016-10-09 DIAGNOSIS — I82532 Chronic embolism and thrombosis of left popliteal vein: Secondary | ICD-10-CM | POA: Diagnosis present

## 2016-10-09 DIAGNOSIS — Y907 Blood alcohol level of 200-239 mg/100 ml: Secondary | ICD-10-CM | POA: Diagnosis present

## 2016-10-09 DIAGNOSIS — S0219XB Other fracture of base of skull, initial encounter for open fracture: Principal | ICD-10-CM | POA: Diagnosis present

## 2016-10-09 DIAGNOSIS — S065XAA Traumatic subdural hemorrhage with loss of consciousness status unknown, initial encounter: Secondary | ICD-10-CM | POA: Diagnosis present

## 2016-10-09 HISTORY — DX: Acute embolism and thrombosis of unspecified deep veins of unspecified lower extremity: I82.409

## 2016-10-09 LAB — PREPARE FRESH FROZEN PLASMA
UNIT DIVISION: 0
Unit division: 0

## 2016-10-09 LAB — CBC WITH DIFFERENTIAL/PLATELET
Basophils Absolute: 0 10*3/uL (ref 0.0–0.1)
Basophils Relative: 0 %
EOS PCT: 0 %
Eosinophils Absolute: 0.1 10*3/uL (ref 0.0–0.7)
HCT: 43.5 % (ref 39.0–52.0)
Hemoglobin: 13.9 g/dL (ref 13.0–17.0)
LYMPHS PCT: 43 %
Lymphs Abs: 6 10*3/uL — ABNORMAL HIGH (ref 0.7–4.0)
MCH: 24 pg — ABNORMAL LOW (ref 26.0–34.0)
MCHC: 32 g/dL (ref 30.0–36.0)
MCV: 75.3 fL — AB (ref 78.0–100.0)
MONO ABS: 0.6 10*3/uL (ref 0.1–1.0)
MONOS PCT: 4 %
Neutro Abs: 7.2 10*3/uL (ref 1.7–7.7)
Neutrophils Relative %: 53 %
PLATELETS: 197 10*3/uL (ref 150–400)
RBC: 5.78 MIL/uL (ref 4.22–5.81)
RDW: 14.6 % (ref 11.5–15.5)
WBC: 13.8 10*3/uL — ABNORMAL HIGH (ref 4.0–10.5)

## 2016-10-09 LAB — TYPE AND SCREEN
ABO/RH(D): A POS
ANTIBODY SCREEN: NEGATIVE
UNIT DIVISION: 0
UNIT DIVISION: 0

## 2016-10-09 LAB — ABO/RH: ABO/RH(D): A POS

## 2016-10-09 LAB — BPAM RBC
BLOOD PRODUCT EXPIRATION DATE: 201807252359
Blood Product Expiration Date: 201807252359
ISSUE DATE / TIME: 201807160149
ISSUE DATE / TIME: 201807160149
Unit Type and Rh: 9500
Unit Type and Rh: 9500

## 2016-10-09 LAB — I-STAT CG4 LACTIC ACID, ED: Lactic Acid, Venous: 6.71 mmol/L (ref 0.5–1.9)

## 2016-10-09 LAB — COMPREHENSIVE METABOLIC PANEL
ALT: 14 U/L — AB (ref 17–63)
AST: 26 U/L (ref 15–41)
Albumin: 4.1 g/dL (ref 3.5–5.0)
Alkaline Phosphatase: 54 U/L (ref 38–126)
Anion gap: 15 (ref 5–15)
BILIRUBIN TOTAL: 0.5 mg/dL (ref 0.3–1.2)
BUN: 10 mg/dL (ref 6–20)
CHLORIDE: 108 mmol/L (ref 101–111)
CO2: 17 mmol/L — ABNORMAL LOW (ref 22–32)
CREATININE: 1.21 mg/dL (ref 0.61–1.24)
Calcium: 8.5 mg/dL — ABNORMAL LOW (ref 8.9–10.3)
GFR calc Af Amer: >60 mL/min (ref >60)
GLUCOSE: 127 mg/dL — AB (ref 65–99)
Potassium: 3 mmol/L — ABNORMAL LOW (ref 3.5–5.1)
Sodium: 140 mmol/L (ref 135–145)
TOTAL PROTEIN: 6.6 g/dL (ref 6.5–8.1)

## 2016-10-09 LAB — MRSA PCR SCREENING: MRSA by PCR: NEGATIVE

## 2016-10-09 LAB — BPAM FFP
BLOOD PRODUCT EXPIRATION DATE: 201807192359
Blood Product Expiration Date: 201807172359
ISSUE DATE / TIME: 201807160150
ISSUE DATE / TIME: 201807160150
UNIT TYPE AND RH: 600
Unit Type and Rh: 6200

## 2016-10-09 LAB — ETHANOL: Alcohol, Ethyl (B): 205 mg/dL — ABNORMAL HIGH (ref <5)

## 2016-10-09 LAB — BLOOD PRODUCT ORDER (VERBAL) VERIFICATION

## 2016-10-09 MED ORDER — TRAMADOL HCL 50 MG PO TABS
50.0000 mg | ORAL_TABLET | Freq: Four times a day (QID) | ORAL | Status: DC
  Administered 2016-10-09 – 2016-10-11 (×7): 50 mg via ORAL
  Filled 2016-10-09 (×9): qty 1

## 2016-10-09 MED ORDER — HYDROCODONE-ACETAMINOPHEN 5-325 MG PO TABS
1.0000 | ORAL_TABLET | ORAL | Status: DC | PRN
  Administered 2016-10-09: 1 via ORAL
  Filled 2016-10-09: qty 1

## 2016-10-09 MED ORDER — ONDANSETRON HCL 4 MG/2ML IJ SOLN
INTRAMUSCULAR | Status: AC
  Administered 2016-10-09: 4 mg via INTRAVENOUS
  Filled 2016-10-09: qty 2

## 2016-10-09 MED ORDER — ONDANSETRON 4 MG PO TBDP: 4.0000 mg | ORAL_TABLET | Freq: Four times a day (QID) | ORAL | Status: DC | PRN

## 2016-10-09 MED ORDER — POTASSIUM CHLORIDE IN NACL 20-0.9 MEQ/L-% IV SOLN
INTRAVENOUS | Status: DC
  Administered 2016-10-09: 1000 mL via INTRAVENOUS
  Filled 2016-10-09 (×3): qty 1000

## 2016-10-09 MED ORDER — CEFAZOLIN SODIUM-DEXTROSE 1-4 GM/50ML-% IV SOLN
1.0000 g | Freq: Three times a day (TID) | INTRAVENOUS | Status: AC
  Administered 2016-10-09 – 2016-10-12 (×9): 1 g via INTRAVENOUS
  Filled 2016-10-09 (×11): qty 50

## 2016-10-09 MED ORDER — ONDANSETRON HCL 4 MG/2ML IJ SOLN
4.0000 mg | Freq: Four times a day (QID) | INTRAMUSCULAR | Status: DC | PRN
  Administered 2016-10-09 – 2016-10-11 (×3): 4 mg via INTRAVENOUS
  Filled 2016-10-09 (×3): qty 2

## 2016-10-09 MED ORDER — TETANUS-DIPHTH-ACELL PERTUSSIS 5-2.5-18.5 LF-MCG/0.5 IM SUSP
0.5000 mL | Freq: Once | INTRAMUSCULAR | Status: AC
  Administered 2016-10-09: 0.5 mL via INTRAMUSCULAR

## 2016-10-09 MED ORDER — CEFAZOLIN SODIUM-DEXTROSE 1-4 GM/50ML-% IV SOLN
1.0000 g | Freq: Once | INTRAVENOUS | Status: AC
  Administered 2016-10-09: 1 g via INTRAVENOUS

## 2016-10-09 MED ORDER — ACETAMINOPHEN 500 MG PO TABS
1000.0000 mg | ORAL_TABLET | Freq: Three times a day (TID) | ORAL | Status: DC
Start: 2016-10-09 — End: 2016-10-12
  Administered 2016-10-09 – 2016-10-11 (×7): 1000 mg via ORAL
  Filled 2016-10-09 (×9): qty 2

## 2016-10-09 MED ORDER — MORPHINE SULFATE (PF) 4 MG/ML IV SOLN
2.0000 mg | INTRAVENOUS | Status: DC | PRN
Start: 2016-10-09 — End: 2016-10-09
  Administered 2016-10-09: 2 mg via INTRAVENOUS
  Filled 2016-10-09: qty 1

## 2016-10-09 MED ORDER — DOCUSATE SODIUM 100 MG PO CAPS
100.0000 mg | ORAL_CAPSULE | Freq: Two times a day (BID) | ORAL | Status: DC
  Administered 2016-10-09 – 2016-10-12 (×6): 100 mg via ORAL
  Filled 2016-10-09 (×6): qty 1

## 2016-10-09 MED ORDER — ONDANSETRON HCL 4 MG/2ML IJ SOLN
4.0000 mg | Freq: Once | INTRAMUSCULAR | Status: AC
  Administered 2016-10-09: 4 mg via INTRAVENOUS

## 2016-10-09 MED ORDER — OXYCODONE HCL 5 MG PO TABS
5.0000 mg | ORAL_TABLET | ORAL | Status: DC | PRN
  Administered 2016-10-09 – 2016-10-12 (×5): 10 mg via ORAL
  Filled 2016-10-09 (×6): qty 2
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
## 2016-10-09 NOTE — ED Notes (Signed)
Patient transported to CT 
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
## 2016-10-09 NOTE — Progress Notes (Signed)
Pt's is bradycardic with fluctuation between 40s and 50s at times going into 30s. Trauma paged, no new orders at this time.  Will continue to monitor.

## 2016-10-09 NOTE — ED Notes (Signed)
MD Preston FleetingGlick notified of patients change of condition

## 2016-10-09 NOTE — ED Notes (Signed)
Neuro surgery at bedside.
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
## 2016-10-09 NOTE — ED Notes (Signed)
LEO at bedside talking to pt
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
## 2016-10-09 NOTE — Progress Notes (Signed)
Preliminary results by tech - Venous Duplex Left Lower Ext. Completed. 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 Jared David, BS, RDMS, RVT'

## 2016-10-10 ENCOUNTER — Inpatient Hospital Stay (HOSPITAL_COMMUNITY): Payer: Self-pay

## 2016-10-10 ENCOUNTER — Encounter (HOSPITAL_COMMUNITY): Payer: Self-pay | Admitting: *Deleted

## 2016-10-10 LAB — HIV ANTIBODY (ROUTINE TESTING W REFLEX): HIV Screen 4th Generation wRfx: NONREACTIVE

## 2016-10-10 MED ORDER — NICOTINE 14 MG/24HR TD PT24
14.0000 mg | MEDICATED_PATCH | Freq: Every day | TRANSDERMAL | Status: DC
  Administered 2016-10-10 – 2016-10-12 (×3): 14 mg via TRANSDERMAL
  Filled 2016-10-10 (×3): qty 1
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
## 2016-10-11 LAB — CBC
HCT: 44 % (ref 39.0–52.0)
HEMOGLOBIN: 14.1 g/dL (ref 13.0–17.0)
MCH: 23.6 pg — ABNORMAL LOW (ref 26.0–34.0)
MCHC: 32 g/dL (ref 30.0–36.0)
MCV: 73.6 fL — AB (ref 78.0–100.0)
PLATELETS: 184 10*3/uL (ref 150–400)
RBC: 5.98 MIL/uL — AB (ref 4.22–5.81)
RDW: 14.6 % (ref 11.5–15.5)
WBC: 12.5 10*3/uL — AB (ref 4.0–10.5)

## 2016-10-11 LAB — GC/CHLAMYDIA PROBE AMP (~~LOC~~) NOT AT ARMC
Chlamydia: POSITIVE — AB
Neisseria Gonorrhea: NEGATIVE

## 2016-10-11 LAB — BASIC METABOLIC PANEL
ANION GAP: 9 (ref 5–15)
BUN: 7 mg/dL (ref 6–20)
CHLORIDE: 101 mmol/L (ref 101–111)
CO2: 26 mmol/L (ref 22–32)
Calcium: 9.2 mg/dL (ref 8.9–10.3)
Creatinine, Ser: 0.92 mg/dL (ref 0.61–1.24)
GFR calc Af Amer: >60 mL/min (ref >60)
GLUCOSE: 93 mg/dL (ref 65–99)
POTASSIUM: 3.7 mmol/L (ref 3.5–5.1)
SODIUM: 136 mmol/L (ref 135–145)
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
## 2016-10-11 NOTE — Progress Notes (Signed)
Patient arrived to floor. Family at side.Will continue to monitor
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
## 2016-10-12 MED ORDER — AZITHROMYCIN 500 MG PO TABS
1000.0000 mg | ORAL_TABLET | Freq: Once | ORAL | Status: AC
  Administered 2016-10-12: 1000 mg via ORAL
  Filled 2016-10-12: qty 2

## 2016-10-12 MED ORDER — OXYCODONE HCL 5 MG PO TABS: 5.0000 mg | ORAL_TABLET | ORAL | 0 refills | Status: DC | PRN

## 2016-10-12 MED ORDER — TRAMADOL HCL 50 MG PO TABS: 50.0000 mg | ORAL_TABLET | Freq: Four times a day (QID) | ORAL | 0 refills | Status: DC

## 2016-10-12 MED ORDER — CEFTRIAXONE SODIUM 1 G IJ SOLR
1.0000 g | Freq: Once | INTRAMUSCULAR | Status: AC
  Administered 2016-10-12: 1 g via INTRAMUSCULAR
  Filled 2016-10-12: qty 10
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
## 2016-10-12 NOTE — Discharge Instructions (Signed)
Traumatic Brain Injury °Traumatic brain injury (TBI) is an injury to your brain from a blow to your head (closed injury) or an object penetrating your skull and entering your brain (open injury). The severity of TBI varies significantly from one person to the next. Some TBIs cause you to pass out (lose consciousness) immediately and for a long period of time. Other TBIs do not cause any loss of consciousness. °Symptoms of any type of TBI can be long lasting (chronic). TBI can interfere with memory and speech. TBI can also cause chronic symptoms like headache or dizziness. °What are the causes? °TBI is caused by a closed or open injury. °What increases the risk? °You may be at higher risk for TBI if you: °· Are 75 or older. °· Are a man. °· Are in a car accident. °· Play a contact sport, especially football, hockey, or soccer. °· Do not wear protective gear while playing sports. °· Are in the military. °· Are a victim of violence. °· Abuse drugs or alcohol. °· Have had a previous TBI. ° °What are the signs or symptoms? °Signs and symptoms of TBI may occur right away or not until days, weeks, or months after the injury. They may last for days, weeks, months, or years. Symptoms may include: °· Loss of consciousness. °· Headache. °· Confusion. °· Fatigue. °· Changes in sleep. °· Dizziness. °· Mood or personality changes. °· Memory problems. °· Nausea or vomiting or both. °· Seizures. °· Clumsiness. °· Slurred speech. °· Depression and anxiety. °· Anger. °· Inability to control emotions or actions (impulse control). °· Loss of or dulling of your senses, such as hearing, vision, and touch. This can include: °? Blurred vision. °? Ringing in your ears. ° °How is this diagnosed? °TBI may be diagnosed by a medical history and physical exam. Your health care provider will also do a neurologic exam to check your: °· Reflexes. °· Sensations. °· Alertness. °· Memory. °· Vision. °· Hearing. °· Coordination. ° °Your health care  provider will also do tests to diagnose the extent of your TBI, such as a CT scan of your brain and skull. One way to determine the severity of your TBI is with a scoring system called the Glasgow Coma Scale (GCS). It measures eye opening, motor response, and verbal response. The higher the score, the milder the TBI. °Your TBI may be described as mild, moderate, or severe: °· Mild TBI (concussion). °? Symptoms of mild TBI usually go away on their own. This can take weeks or months, depending on the type of concussion. °? Your GCS will be 13-15. °? Your brain CT scan will be normal. °? You may or may not have a short hospital stay. °· Moderate TBI. °? Your GCS will be 9-12. °? Your brain CT scan will be abnormal. °? You will likely need a short hospital stay. °· Severe TBI. °? Your GCS will be 3-8. °? Your brain CT scan will be abnormal. °? You may need a long stay in the hospital. ° °How is this treated? °Emergency treatment of TBI may involve measures to maintain a clear airway and stable blood pressure. Brain surgery may be needed to: °· Remove a blood clot. °· Repair bleeding. °· Remove an object that has penetrated the brain, such as a skull fragment or a bullet. ° °Other treatments depend on your chronic signs and symptoms. These treatments include the following types of therapy: °· Physical. °· Occupational. °· Speech and language. °· Mental health. °·   Social support. ° °Follow these instructions at home: °· Carefully follow all your health care provider's instructions. °· Work closely with all your therapists, if necessary. °· Take medicines only as directed by your health care provider. Do not take aspirin or other anti-inflammatory medicines such as ibuprofen or naproxen unless approved by your health care provider. °· Do not abuse illegal drugs. °· Limit alcohol intake to no more than 1 drink per day for nonpregnant women and 2 drinks per day for men. One drink equals 12 ounces of beer, 5 ounces of wine,  or 1½ ounces of hard liquor. °· Avoid any situation where there is potential for another head injury, such as football, hockey, soccer, basketball, martial arts, downhill snow sports, and horseback riding. Do not do these activities until your health care provider approves. °· Rest. Rest helps the brain to heal. Make sure you: °? Get plenty of sleep at night. Avoid staying up late at night. °? Keep the same bedtime hours on weekends and weekdays. °? Rest during the day. Take daytime naps or rest breaks when you feel tired. °· Avoid excessive visual stimulation while recovering from a TBI. This includes work on the computer, watching TV, and reading. °· Try to avoid activities that cause physical or mental stress. Stay home from work or school as directed by your health care provider. °· Make lists to plan your day and help your memory. °· Do not drive, ride a bicycle, or operate heavy machinery until your health care provider approves. °· Seek support from friends and family. °· Keep all follow-up visits as directed by your health care provider. This is important. °· Watch your symptoms and tell others to do the same. Complications sometimes occur after a TBI. °How is this prevented? °· Wear a helmet while biking, skiing, skateboarding, skating, or doing similar activities. Wear your seatbelt while driving. °· Do not abuse alcohol or drugs. °· Do not drink and drive. °· Prevent falls at home by: °? Removing clutter and tripping hazards, including loose rugs, from floors and stairways. °? Using grab bars in bathrooms and handrails by stairs. °? Placing nonslip mats on floors and in bathtubs. °? Improving lighting in dim areas. °Contact a health care provider if: °Seek medical care if you have any of the following symptoms for more than two weeks after your injury: °· Chronic headaches. °· Dizziness or balance problems. °· Nausea. °· Vision problems. °· Increased sensitivity to noise or light. °· Depression or mood  swings. °· Anxiety or irritability. °· Memory problems. °· Difficulty concentrating or paying attention. °· Sleep problems. °· Feeling tired all the time. ° °Get help right away if: °· You have confusion or unusual drowsiness. °· It is difficult to wake you up. °· You have nausea or persistent, forceful vomiting. °· You feel like you are moving when you are not (vertigo). Your eyes may move rapidly back and forth. °· You have convulsions or faint. °· You have severe, persistent headaches that are not relieved by medicine. °· You cannot use your arms or legs normally. °· One of your pupils is larger than the other. °· You have clear or bloody discharge from your nose or ears. °· Your problems are getting worse, not better. °This information is not intended to replace advice given to you by your health care provider. Make sure you discuss any questions you have with your health care provider. °Document Released: 03/03/2002 Document Revised: 11/14/2015 Document Reviewed: 06/26/2013 °Elsevier Interactive Patient Education ©   2017 [employer]   I have put in contact information for a couple of speech therapy locations, one in [place] and two locations in [place]. Take the written prescription with you where ever you decide to go.

## 2016-10-12 NOTE — Progress Notes (Signed)
Discharge instructions given. Pt verbalized understanding and all questions were answered.  

## 2016-10-13 ENCOUNTER — Encounter: Payer: Self-pay | Admitting: Hematology and Oncology

## 2016-10-16 ENCOUNTER — Encounter (HOSPITAL_COMMUNITY): Payer: Self-pay | Admitting: *Deleted

## 2016-10-16 ENCOUNTER — Emergency Department (HOSPITAL_COMMUNITY)
Admission: EM | Admit: 2016-10-16 | Discharge: 2016-10-16 | Disposition: A | Discharge status: Home / Self Care | Payer: Self-pay | Attending: Emergency Medicine | Admitting: Emergency Medicine

## 2016-10-16 DIAGNOSIS — Z4802 Encounter for removal of sutures: Secondary | ICD-10-CM

## 2016-10-16 DIAGNOSIS — S0181XD Laceration without foreign body of other part of head, subsequent encounter: Secondary | ICD-10-CM | POA: Insufficient documentation

## 2016-10-16 DIAGNOSIS — F17210 Nicotine dependence, cigarettes, uncomplicated: Secondary | ICD-10-CM | POA: Insufficient documentation

## 2016-10-16 NOTE — ED Triage Notes (Signed)
Pt here for suture removal to left upper forehead and placed last Monday.  Site unremarkable
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
## 2016-10-16 NOTE — Discharge Instructions (Signed)
You were seen in the emergency department for suture removal. Please continue to keep area clean and dry; apply bacitracin at least once a day. Continue to follow up as previously instructed. Return to the ER if you experience fevers, chills, unexplained weight loss, dizziness, vision or gait changes, sudden or severe headaches, neck pain, chest pain, shortness of breath, abdominal pain, nausea, vomiting, extremity numbness/tingling/weakness, redness/swelling/warmth/drainage/bleeding to site, or any additional concerns.

## 2016-10-20 ENCOUNTER — Encounter (HOSPITAL_COMMUNITY): Payer: Self-pay | Admitting: Emergency Medicine

## 2016-10-20 ENCOUNTER — Emergency Department (HOSPITAL_COMMUNITY)
Admission: EM | Admit: 2016-10-20 | Discharge: 2016-10-20 | Disposition: A | Discharge status: Home / Self Care | Payer: Self-pay | Attending: Emergency Medicine | Admitting: Emergency Medicine

## 2016-10-20 ENCOUNTER — Emergency Department (HOSPITAL_COMMUNITY): Payer: Self-pay

## 2016-10-20 DIAGNOSIS — R112 Nausea with vomiting, unspecified: Secondary | ICD-10-CM | POA: Insufficient documentation

## 2016-10-20 DIAGNOSIS — G44319 Acute post-traumatic headache, not intractable: Secondary | ICD-10-CM

## 2016-10-20 DIAGNOSIS — S065X0D Traumatic subdural hemorrhage without loss of consciousness, subsequent encounter: Secondary | ICD-10-CM | POA: Insufficient documentation

## 2016-10-20 DIAGNOSIS — F17210 Nicotine dependence, cigarettes, uncomplicated: Secondary | ICD-10-CM | POA: Insufficient documentation

## 2016-10-20 DIAGNOSIS — R51 Headache: Secondary | ICD-10-CM | POA: Insufficient documentation

## 2016-10-20 LAB — BASIC METABOLIC PANEL
Anion gap: 11 (ref 5–15)
BUN: 6 mg/dL (ref 6–20)
CALCIUM: 9.7 mg/dL (ref 8.9–10.3)
CO2: 23 mmol/L (ref 22–32)
CREATININE: 0.87 mg/dL (ref 0.61–1.24)
Chloride: 103 mmol/L (ref 101–111)
GFR calc Af Amer: >60 mL/min (ref >60)
GLUCOSE: 94 mg/dL (ref 65–99)
Potassium: 3.9 mmol/L (ref 3.5–5.1)
Sodium: 137 mmol/L (ref 135–145)

## 2016-10-20 LAB — CBC WITH DIFFERENTIAL/PLATELET
BASOS ABS: 0 10*3/uL (ref 0.0–0.1)
Basophils Relative: 0 %
EOS PCT: 1 %
Eosinophils Absolute: 0.1 10*3/uL (ref 0.0–0.7)
HEMATOCRIT: 42.9 % (ref 39.0–52.0)
Hemoglobin: 13.2 g/dL (ref 13.0–17.0)
LYMPHS ABS: 2.9 10*3/uL (ref 0.7–4.0)
Lymphocytes Relative: 24 %
MCH: 22.6 pg — ABNORMAL LOW (ref 26.0–34.0)
MCHC: 30.8 g/dL (ref 30.0–36.0)
MCV: 73.3 fL — AB (ref 78.0–100.0)
MONO ABS: 0.7 10*3/uL (ref 0.1–1.0)
MONOS PCT: 6 %
NEUTROS ABS: 8.4 10*3/uL — AB (ref 1.7–7.7)
Neutrophils Relative %: 69 %
PLATELETS: 310 10*3/uL (ref 150–400)
RBC: 5.85 MIL/uL — AB (ref 4.22–5.81)
RDW: 14.1 % (ref 11.5–15.5)
WBC: 12.1 10*3/uL — AB (ref 4.0–10.5)

## 2016-10-20 LAB — PROTIME-INR
INR: 1.06
Prothrombin Time: 13.8 seconds (ref 11.4–15.2)

## 2016-10-20 MED ORDER — MORPHINE SULFATE (PF) 4 MG/ML IV SOLN
4.0000 mg | Freq: Once | INTRAVENOUS | Status: AC
  Administered 2016-10-20: 4 mg via INTRAVENOUS
  Filled 2016-10-20: qty 1

## 2016-10-20 MED ORDER — PREDNISONE 10 MG PO TABS: ORAL_TABLET | ORAL | 0 refills | Status: DC

## 2016-10-20 MED ORDER — PREDNISONE 20 MG PO TABS
60.0000 mg | ORAL_TABLET | Freq: Once | ORAL | Status: AC
  Administered 2016-10-20: 60 mg via ORAL
  Filled 2016-10-20: qty 3

## 2016-10-20 MED ORDER — LEVETIRACETAM 500 MG PO TABS: 500.0000 mg | ORAL_TABLET | Freq: Two times a day (BID) | ORAL | 0 refills | Status: DC

## 2016-10-20 MED ORDER — ONDANSETRON HCL 4 MG/2ML IJ SOLN
4.0000 mg | Freq: Once | INTRAMUSCULAR | Status: AC
  Administered 2016-10-20: 4 mg via INTRAVENOUS
  Filled 2016-10-20: qty 2

## 2016-10-20 MED ORDER — LEVETIRACETAM 500 MG PO TABS
500.0000 mg | ORAL_TABLET | Freq: Once | ORAL | Status: AC
  Administered 2016-10-20: 500 mg via ORAL
  Filled 2016-10-20: qty 1
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
## 2016-10-20 NOTE — ED Notes (Signed)
Per Dr. Jacqulyn BathLong, upgrade acuity to 2
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
## 2016-10-20 NOTE — Discharge Instructions (Signed)
Take medications as prescribed. Follow up with Dr. Yetta BarreJones in his office next Friday by calling the office on Monday morning to schedule a recheck appointment. Return to the emergency department with any new or concerning symptoms.
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
## 2016-10-20 NOTE — ED Notes (Signed)
Pt brady 38-40. EDP made aware.
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
## 2016-10-23 ENCOUNTER — Telehealth: Payer: Self-pay | Admitting: Surgery

## 2016-10-23 NOTE — Telephone Encounter (Signed)
ED CM received call from patient concerning medication assistance. CM returned call patient reports receiving prescriptions from the ED and cannot afford them. CM reviewed record noted patient received MATCH letter for med assistance on 7/19. CM explained the guidelines of MATCH program once per year. CM discussed the Goodrx program, patient is agreeable to receive coupons via text for prescriptions CM sent coupons for prescription to patient's phone. CM discussed the importance and benefits of a PCP and Medical Home, patient verbalized understanding but wants to f/u up Dr. Yetta BarreJones on 8/13 before scheduling a PCP appointment.  No further ED CM needs identified.

## 2016-10-27 ENCOUNTER — Other Ambulatory Visit: Payer: Self-pay | Admitting: Neurological Surgery

## 2016-10-27 DIAGNOSIS — S065X9A Traumatic subdural hemorrhage with loss of consciousness of unspecified duration, initial encounter: Secondary | ICD-10-CM

## 2016-10-27 DIAGNOSIS — S065XAA Traumatic subdural hemorrhage with loss of consciousness status unknown, initial encounter: Secondary | ICD-10-CM

## 2016-11-04 ENCOUNTER — Emergency Department: Payer: Self-pay

## 2016-11-04 ENCOUNTER — Encounter: Payer: Self-pay | Admitting: Emergency Medicine

## 2016-11-04 ENCOUNTER — Inpatient Hospital Stay
Admission: EM | Admit: 2016-11-04 | Discharge: 2016-11-06 | DRG: 176 | Disposition: A | Discharge status: Home / Self Care | Payer: Self-pay | Attending: Internal Medicine | Admitting: Internal Medicine

## 2016-11-04 DIAGNOSIS — S065X9A Traumatic subdural hemorrhage with loss of consciousness of unspecified duration, initial encounter: Secondary | ICD-10-CM

## 2016-11-04 DIAGNOSIS — Z9114 Patient's other noncompliance with medication regimen: Secondary | ICD-10-CM

## 2016-11-04 DIAGNOSIS — Z86718 Personal history of other venous thrombosis and embolism: Secondary | ICD-10-CM

## 2016-11-04 DIAGNOSIS — I2699 Other pulmonary embolism without acute cor pulmonale: Principal | ICD-10-CM | POA: Diagnosis present

## 2016-11-04 DIAGNOSIS — Z87820 Personal history of traumatic brain injury: Secondary | ICD-10-CM

## 2016-11-04 DIAGNOSIS — D72829 Elevated white blood cell count, unspecified: Secondary | ICD-10-CM | POA: Diagnosis present

## 2016-11-04 DIAGNOSIS — D6859 Other primary thrombophilia: Secondary | ICD-10-CM | POA: Diagnosis present

## 2016-11-04 DIAGNOSIS — F17210 Nicotine dependence, cigarettes, uncomplicated: Secondary | ICD-10-CM | POA: Diagnosis present

## 2016-11-04 DIAGNOSIS — S065XAA Traumatic subdural hemorrhage with loss of consciousness status unknown, initial encounter: Secondary | ICD-10-CM

## 2016-11-04 LAB — APTT: APTT: 33 s (ref 24–36)

## 2016-11-04 LAB — BASIC METABOLIC PANEL
ANION GAP: 8 (ref 5–15)
BUN: 9 mg/dL (ref 6–20)
CHLORIDE: 101 mmol/L (ref 101–111)
CO2: 28 mmol/L (ref 22–32)
Calcium: 9.5 mg/dL (ref 8.9–10.3)
Creatinine, Ser: 0.82 mg/dL (ref 0.61–1.24)
GFR calc Af Amer: >60 mL/min (ref >60)
GLUCOSE: 124 mg/dL — AB (ref 65–99)
POTASSIUM: 3.8 mmol/L (ref 3.5–5.1)
Sodium: 137 mmol/L (ref 135–145)

## 2016-11-04 LAB — CBC
HEMATOCRIT: 39.6 % — AB (ref 40.0–52.0)
HEMOGLOBIN: 12.8 g/dL — AB (ref 13.0–18.0)
MCH: 23.3 pg — ABNORMAL LOW (ref 26.0–34.0)
MCHC: 32.3 g/dL (ref 32.0–36.0)
MCV: 72.1 fL — ABNORMAL LOW (ref 80.0–100.0)
Platelets: 241 10*3/uL (ref 150–440)
RBC: 5.49 MIL/uL (ref 4.40–5.90)
RDW: 14.5 % (ref 11.5–14.5)
WBC: 19.1 10*3/uL — ABNORMAL HIGH (ref 3.8–10.6)

## 2016-11-04 LAB — TROPONIN I: Troponin I: 0.03 ng/mL (ref <0.03)

## 2016-11-04 LAB — HEPATIC FUNCTION PANEL
ALK PHOS: 53 U/L (ref 38–126)
ALT: 29 U/L (ref 17–63)
AST: 18 U/L (ref 15–41)
Albumin: 4 g/dL (ref 3.5–5.0)
Bilirubin, Direct: <0.1 mg/dL — ABNORMAL LOW (ref 0.1–0.5)
TOTAL PROTEIN: 7.2 g/dL (ref 6.5–8.1)
Total Bilirubin: 0.6 mg/dL (ref 0.3–1.2)

## 2016-11-04 LAB — LIPASE, BLOOD: Lipase: 26 U/L (ref 11–51)

## 2016-11-04 LAB — PROTIME-INR
INR: 1.09
PROTHROMBIN TIME: 14.1 s (ref 11.4–15.2)

## 2016-11-04 MED ORDER — NICOTINE 14 MG/24HR TD PT24
14.0000 mg | MEDICATED_PATCH | Freq: Every day | TRANSDERMAL | Status: DC
  Administered 2016-11-04 – 2016-11-06 (×3): 14 mg via TRANSDERMAL
  Filled 2016-11-04 (×3): qty 1

## 2016-11-04 MED ORDER — ONDANSETRON HCL 4 MG PO TABS: 4.0000 mg | ORAL_TABLET | Freq: Four times a day (QID) | ORAL | Status: DC | PRN

## 2016-11-04 MED ORDER — MORPHINE SULFATE (PF) 4 MG/ML IV SOLN
4.0000 mg | Freq: Once | INTRAVENOUS | Status: AC
  Administered 2016-11-04: 4 mg via INTRAVENOUS
  Filled 2016-11-04: qty 1

## 2016-11-04 MED ORDER — HEPARIN BOLUS VIA INFUSION
3600.0000 [IU] | Freq: Once | INTRAVENOUS | Status: AC
  Administered 2016-11-04: 3600 [IU] via INTRAVENOUS
  Filled 2016-11-04: qty 3600

## 2016-11-04 MED ORDER — LEVETIRACETAM 500 MG PO TABS
500.0000 mg | ORAL_TABLET | Freq: Two times a day (BID) | ORAL | Status: DC
  Administered 2016-11-04 – 2016-11-06 (×4): 500 mg via ORAL
  Filled 2016-11-04 (×6): qty 1

## 2016-11-04 MED ORDER — IOPAMIDOL (ISOVUE-370) INJECTION 76%
75.0000 mL | Freq: Once | INTRAVENOUS | Status: AC | PRN
  Administered 2016-11-04: 75 mL via INTRAVENOUS

## 2016-11-04 MED ORDER — HEPARIN (PORCINE) IN NACL 100-0.45 UNIT/ML-% IJ SOLN
16.0000 [IU]/kg/h | INTRAMUSCULAR | Status: DC
  Administered 2016-11-04: 16 [IU]/kg/h via INTRAVENOUS
  Filled 2016-11-04 (×3): qty 250

## 2016-11-04 MED ORDER — ONDANSETRON HCL 4 MG/2ML IJ SOLN
4.0000 mg | Freq: Once | INTRAMUSCULAR | Status: AC
  Administered 2016-11-04: 4 mg via INTRAVENOUS
  Filled 2016-11-04: qty 2

## 2016-11-04 MED ORDER — ONDANSETRON HCL 4 MG/2ML IJ SOLN: 4.0000 mg | Freq: Four times a day (QID) | INTRAMUSCULAR | Status: DC | PRN

## 2016-11-04 MED ORDER — KETOROLAC TROMETHAMINE 30 MG/ML IJ SOLN
30.0000 mg | Freq: Four times a day (QID) | INTRAMUSCULAR | Status: DC | PRN
  Administered 2016-11-04: 30 mg via INTRAVENOUS
  Filled 2016-11-04: qty 1

## 2016-11-04 MED ORDER — ACETAMINOPHEN 325 MG PO TABS: 650.0000 mg | ORAL_TABLET | Freq: Four times a day (QID) | ORAL | Status: DC | PRN

## 2016-11-04 MED ORDER — ACETAMINOPHEN 650 MG RE SUPP: 650.0000 mg | Freq: Four times a day (QID) | RECTAL | Status: DC | PRN
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
## 2016-11-04 NOTE — Progress Notes (Signed)
ANTICOAGULATION CONSULT NOTE - Initial Consult  Pharmacy Consult for heparin for PE Indication: pulmonary embolus  Allergies  Allergen Reactions  . Percocet [Oxycodone-Acetaminophen] Nausea And Vomiting    Patient Measurements: Height: 5\' 11"  (180.3 cm) Weight: 160 lb (72.6 kg) IBW/kg (Calculated) : 75.3 Heparin Dosing Weight: 72.6kg   Vital Signs: Temp: 98.9 F (37.2 C) (08/11 1407) Temp Source: Oral (08/11 1407) BP: 149/80 (08/11 1530) Pulse Rate: 48 (08/11 1530)  Labs:  Recent Labs  11/04/16 1447  HGB 12.8*  HCT 39.6*  PLT 241  CREATININE 0.82  TROPONINI 0.03*    Estimated Creatinine Clearance: 141.4 mL/min (by C-G formula based on SCr of 0.82 mg/dL).   Medical History: Past Medical History:  Diagnosis Date  . DVT (deep venous thrombosis) (HCC)   . DVT (deep venous thrombosis) (HCC) 05/31/2015  . DVT of lower extremity (deep venous thrombosis) (HCC)    DVT  . 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 heparin  3,600 Units Intravenous Once   Infusions:  . heparin     PRN:   Assessment: 26 y/o male presents to ED with PE, to be started on heparin gtt per protocol.    Goal of Therapy:  Heparin level 0.3-0.7 units/ml Monitor platelets by anticoagulation protocol: Yes   Plan:  Give 3600 units bolus x 1 Start heparin infusion at 1150 units/hr Check anti-Xa level in 6 hours and daily while on heparin Continue to monitor H&H and platelets  Gerre PebblesGarrett Barrington Worley 11/04/2016,4:47 PM

## 2016-11-04 NOTE — ED Triage Notes (Signed)
Pt here for difficulty getting breath and severe right rib pain.  No injuries that pt knows of.  Pain came on all of a sudden yesterday and has severe pain with breathing.  Hx DVT, not on blood thinners any more.  No distress currently but appears to be in pain/uncomfortable.  VSS.  Pain from RUQ/ribs and radiates to right chest/back/shoulder area.

## 2016-11-04 NOTE — ED Notes (Signed)
Dr. Rifenbark aware of Troponin of 0.03. 

## 2016-11-04 NOTE — ED Notes (Signed)
Patient taken to xray.
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
## 2016-11-04 NOTE — ED Notes (Signed)
Patient returned from CT scan.

## 2016-11-04 NOTE — Plan of Care (Signed)
Problem: Safety: Goal: Ability to remain free from injury will improve Outcome: Progressing Pt placed on Moderate Fall Risks and Bleeding Precautions on admission, 11/04/2016.

## 2016-11-04 NOTE — Plan of Care (Signed)
Problem: Activity: Goal: Risk for activity intolerance will decrease Outcome: Not Progressing CT Chest 11/04/2016 - Bilateral PE worse on right than left with associated developing pulmonary infarct; dyspnea on exertion verbalized by pt on admission

## 2016-11-05 ENCOUNTER — Inpatient Hospital Stay (HOSPITAL_COMMUNITY)
Admit: 2016-11-05 | Discharge: 2016-11-05 | Disposition: A | Discharge status: Home / Self Care | Payer: Self-pay | Attending: Specialist | Admitting: Specialist

## 2016-11-05 DIAGNOSIS — R7989 Other specified abnormal findings of blood chemistry: Secondary | ICD-10-CM

## 2016-11-05 LAB — URINALYSIS, COMPLETE (UACMP) WITH MICROSCOPIC
BACTERIA UA: NONE SEEN
Bilirubin Urine: NEGATIVE
GLUCOSE, UA: NEGATIVE mg/dL
Hgb urine dipstick: NEGATIVE
KETONES UR: 5 mg/dL — AB
Leukocytes, UA: NEGATIVE
Nitrite: NEGATIVE
PROTEIN: NEGATIVE mg/dL
SQUAMOUS EPITHELIAL / LPF: NONE SEEN
Specific Gravity, Urine: 1.012 (ref 1.005–1.030)
pH: 8 (ref 5.0–8.0)

## 2016-11-05 LAB — CBC
HCT: 41.8 % (ref 40.0–52.0)
Hemoglobin: 13.4 g/dL (ref 13.0–18.0)
MCH: 23.4 pg — AB (ref 26.0–34.0)
MCHC: 32 g/dL (ref 32.0–36.0)
MCV: 73.2 fL — ABNORMAL LOW (ref 80.0–100.0)
PLATELETS: 216 10*3/uL (ref 150–440)
RBC: 5.71 MIL/uL (ref 4.40–5.90)
RDW: 14.6 % — AB (ref 11.5–14.5)
WBC: 16.6 10*3/uL — ABNORMAL HIGH (ref 3.8–10.6)

## 2016-11-05 LAB — ECHOCARDIOGRAM COMPLETE
HEIGHTINCHES: 71 in
WEIGHTICAEL: 2753.6 [oz_av]

## 2016-11-05 LAB — TROPONIN I: Troponin I: <0.03 ng/mL (ref <0.03)

## 2016-11-05 LAB — HEPARIN LEVEL (UNFRACTIONATED): Heparin Unfractionated: 0.39 IU/mL (ref 0.30–0.70)

## 2016-11-05 MED ORDER — HYDROCODONE-ACETAMINOPHEN 5-325 MG PO TABS
1.0000 | ORAL_TABLET | ORAL | Status: DC | PRN
  Administered 2016-11-05 – 2016-11-06 (×3): 1 via ORAL
  Filled 2016-11-05 (×3): qty 1

## 2016-11-05 MED ORDER — APIXABAN 5 MG PO TABS: 5.0000 mg | ORAL_TABLET | Freq: Two times a day (BID) | ORAL | Status: DC

## 2016-11-05 MED ORDER — APIXABAN 5 MG PO TABS
10.0000 mg | ORAL_TABLET | Freq: Two times a day (BID) | ORAL | Status: DC
  Administered 2016-11-05 – 2016-11-06 (×3): 10 mg via ORAL
  Filled 2016-11-05 (×3): qty 2

## 2016-11-05 NOTE — Progress Notes (Signed)
ANTICOAGULATION CONSULT NOTE - Initial Consult  Pharmacy Consult for Apixaban Dosing Indication: pulmonary embolus  Allergies  Allergen Reactions  . Percocet [Oxycodone-Acetaminophen] Nausea And Vomiting    Patient Measurements: Height: 5\' 11"  (180.3 cm) Weight: 172 lb 1.6 oz (78.1 kg) IBW/kg (Calculated) : 75.3  Vital Signs: Temp: 98.2 F (36.8 C) (08/12 0753) Temp Source: Oral (08/12 0753) BP: 137/57 (08/12 0753) Pulse Rate: 89 (08/12 0753)  Labs:  Recent Labs  11/04/16 1447 11/04/16 1448 11/04/16 1712 11/04/16 2126 11/05/16 0435  HGB 12.8*  --   --   --  13.4  HCT 39.6*  --   --   --  41.8  PLT 241  --   --   --  216  APTT  --  33  --   --   --   LABPROT  --  14.1  --   --   --   INR  --  1.09  --   --   --   HEPARINUNFRC  --   --   --   --  0.39  CREATININE 0.82  --   --   --   --   TROPONINI 0.03*  --  <0.03 <0.03 <0.03    Estimated Creatinine Clearance: 146.7 mL/min (by C-G formula based on SCr of 0.82 mg/dL).   Medical History: Past Medical History:  Diagnosis Date  . DVT (deep venous thrombosis) (HCC)   . DVT (deep venous thrombosis) (HCC) 05/31/2015  . DVT of lower extremity (deep venous thrombosis) (HCC)    DVT  . 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 Patient has been receiving treatment dose heparin inpatient.   Plan:  Heparin drip discontinued Will start patient on Apixaban 10mg  twice daily x 7 days, followed by apixaban 5mg  twice daily.   Gardner CandleSheema M Trinisha Paget, PharmD, BCPS Clinical Pharmacist 11/05/2016 12:12 PM

## 2016-11-05 NOTE — Clinical Social Work Note (Signed)
CSW received consult for medication assistance and PCP needs. RNCM is the appropriate recipient of this request. CSW has advised the RNCM. CSW is signing off.  Argentina PonderKaren Martha Andrus Sharp, MSW, Theresia MajorsLCSWA [phone]

## 2016-11-05 NOTE — Progress Notes (Signed)
[employer] [facility] [place] [facility]   PATIENT NAME: Jared David    MR#:  [id number]  DATE OF BIRTH:  20-Feb-1991  SUBJECTIVE:  CHIEF COMPLAINT:   Chief Complaint  Patient presents with  . Abdominal Pain   - History of protein C deficiency, prior history of DVT off of anticoagulation, recent admission in July 2018 for subdural hematoma presents to hospital secondary to shortness of breath and right-sided chest pain. -On heparin drip this morning for new diagnosis of pulmonary embolism  REVIEW OF SYSTEMS:  Review of Systems  Constitutional: Negative for chills, fever and malaise/fatigue.  HENT: Negative for hearing loss.   Eyes: Negative for blurred vision and double vision.  Respiratory: Positive for shortness of breath. Negative for cough and wheezing.   Cardiovascular: Positive for chest pain. Negative for palpitations and leg swelling.  Gastrointestinal: Negative for abdominal pain, constipation, diarrhea, nausea and vomiting.  Genitourinary: Negative for dysuria.  Musculoskeletal: Negative for myalgias.  Neurological: Negative for dizziness, sensory change, speech change, focal weakness, seizures and headaches.  Psychiatric/Behavioral: Negative for depression.    DRUG ALLERGIES:   Allergies  Allergen Reactions  . Percocet [Oxycodone-Acetaminophen] Nausea And Vomiting    VITALS:  Blood pressure 130/68, pulse 82, temperature 98.2 F (36.8 C), temperature source Oral, resp. rate 20, height 5\' 11"  (1.803 m), weight 78.1 kg (172 lb 1.6 oz), SpO2 100 %.  PHYSICAL EXAMINATION:  Physical Exam  GENERAL:  26 y.o.-year-old patient lying in the bed with no acute distress.  EYES: Pupils equal, round, reactive to light and accommodation. No scleral icterus. Extraocular muscles intact.  HEENT: Head atraumatic, normocephalic. Oropharynx and nasopharynx clear.  NECK:  Supple, no jugular venous distention. No thyroid enlargement, no tenderness.  LUNGS: Normal  breath sounds bilaterally, no wheezing, rales,rhonchi or crepitation. No use of accessory muscles of respiration.  CARDIOVASCULAR: S1, S2 normal. No murmurs, rubs, or gallops.  ABDOMEN: Soft, nontender, nondistended. Bowel sounds present. No organomegaly or mass.  EXTREMITIES: No pedal edema, cyanosis, or clubbing.  NEUROLOGIC: Cranial nerves II through XII are intact. Muscle strength 5/5 in all extremities. Sensation intact. Gait not checked.  PSYCHIATRIC: The patient is alert and oriented x 3.  SKIN: No obvious rash, lesion, or ulcer.    LABORATORY PANEL:   CBC  Recent Labs Lab 11/05/16 0435  WBC 16.6*  HGB 13.4  HCT 41.8  PLT 216   ------------------------------------------------------------------------------------------------------------------  Chemistries   Recent Labs Lab 11/04/16 1447  NA 137  K 3.8  CL 101  CO2 28  GLUCOSE 124*  BUN 9  CREATININE 0.82  CALCIUM 9.5  AST 18  ALT 29  ALKPHOS 53  BILITOT 0.6   ------------------------------------------------------------------------------------------------------------------  Cardiac Enzymes  Recent Labs Lab 11/05/16 0435  TROPONINI <0.03   ------------------------------------------------------------------------------------------------------------------  RADIOLOGY:  Dg Chest 2 View  Result Date: 11/04/2016 CLINICAL DATA:  Worsening RIGHT side chest pain and shortness of breath for 2 days, smoker, history DVT, protein C deficiency EXAM: CHEST  2 VIEW COMPARISON:  None FINDINGS: Enlargement of cardiac silhouette. Mediastinal contours and pulmonary vascularity normal. Atelectasis versus infiltrate at lateral RIGHT lung base. Remaining lungs clear. No pleural effusion or pneumothorax. IVC filter noted in abdomen. No acute osseous findings. IMPRESSION: Mild infiltrate versus atelectasis at lateral RIGHT lung base. Enlargement of cardiac silhouette. Electronically Signed   By: Ulyses Southward M.D.   On: 11/04/2016 14:52    Ct Head Wo Contrast  Result Date: 11/04/2016 CLINICAL DATA:  Headaches EXAM: CT HEAD  WITHOUT CONTRAST TECHNIQUE: Contiguous axial images were obtained from the base of the skull through the vertex without intravenous contrast. COMPARISON:  10/20/2016 FINDINGS: Brain: Previously seen left-sided subdural hematoma is again identified although the acute component has resolved. The fluid has a more chronic appearance. No acute hemorrhage, acute infarction or space-occupying mass lesion is seen. Vascular: No hyperdense vessel or unexpected calcification. Skull: Left temporal bone fracture is again seen and stable. Sinuses/Orbits: No acute finding. Other: None IMPRESSION: Improving left subdural hematoma. No acute component is noted on the current exam. Stable appearing left temporal bone fracture. No new acute abnormality is seen. Electronically Signed   By: Alcide Clever M.D.   On: 11/04/2016 16:13   Ct Angio Chest Pe W/cm &/or Wo Cm  Result Date: 11/04/2016 CLINICAL DATA:  Chest pain and shortness of Breath EXAM: CT ANGIOGRAPHY CHEST WITH CONTRAST TECHNIQUE: Multidetector CT imaging of the chest was performed using the standard protocol during bolus administration of intravenous contrast. Multiplanar CT image reconstructions and MIPs were obtained to evaluate the vascular anatomy. CONTRAST:  75 mL Isovue 370. COMPARISON:  None. FINDINGS: Cardiovascular: Thoracic aorta is within normal limits. No aneurysmal dilatation or dissection is seen. Pulmonary artery demonstrates a normal branching pattern. Multiple bilateral filling defects are noted somewhat greater on the right than the left consistent with pulmonary emboli. No heart strain is noted. Mediastinum/Nodes: No significant hilar or mediastinal adenopathy is noted. Lungs/Pleura: Rounded opacities node the lateral aspect of the right lower lobe most consistent with a developing pulmonary infarct secondary to the known pulmonary emboli. Mild dependent  atelectatic changes are noted. No sizable effusion seen. Upper Abdomen: Visualized upper abdomen is within normal limits. Musculoskeletal: No acute bony abnormality is noted. Review of the MIP images confirms the above findings. IMPRESSION: Bilateral pulmonary emboli worse on the right than the left with associated developing pulmonary infarct. No right heart strain is noted. No other focal abnormality is seen. Electronically Signed   By: Alcide Clever M.D.   On: 11/04/2016 16:10    EKG:   Orders placed or performed during the hospital encounter of 11/04/16  . EKG 12-Lead  . EKG 12-Lead  . ED EKG within 10 minutes  . ED EKG within 10 minutes    ASSESSMENT AND PLAN:   26 year old male with past medical history significant for protein C deficiency, history of DVT noted in anticoagulation, recent subdural hematoma in July 2018 on seizure prophylaxis, alcohol use presents to hospital secondary to shortness of breath and right-sided chest pain and noted to have acute pulmonary embolism.  #1 bilateral acute pulmonary emboli with right lower lobe pulmonary infarct -Causing the pleurisy from the pulmonary infarct. -Started on heparin drip. Known history of protein C deficiency. Was started on Xarelto several years ago but stopped almost 3 years ago. -DC heparin drip and will start on oral anticoagulation with eliquis. Care management consult for outpatient help with the medication. -Discussed about compliance. -Will need lifelong anticoagulation with known history of protein C deficiency -Dopplers of lower extremities done in July 2018 at: Showed chronic left-sided clots, patient is status post IVC filter.  #2 recent trauma resulting in subdural hematoma-admitted to [facility] for observation. Started on Keppra for seizure prophylaxis. CT of the head confirming stable hematoma. -Monitor while on anticoagulation for any new neurological symptoms  #3 leukocytosis-likely from underlying pulmonary emboli  and infarct. Check urine analysis. Patient denies any complaints though   Anticipated discharge tomorrow once oral anticoagulation is set up with outpatient  medical management clinic    All the records are reviewed and case discussed with Care Management/Social Workerr. Management plans discussed with the patient, family and they are in agreement.  CODE STATUS: Full Code  TOTAL TIME TAKING CARE OF THIS PATIENT: 37 minutes.   POSSIBLE D/C IN 1-2 DAYS, DEPENDING ON CLINICAL CONDITION.   Enid BaasKALISETTI,Geo Slone M.D on 11/05/2016 at 3:26 PM  Between 7am to 6pm - Pager - [phone]  After 6pm go to [email or web address] - password [email or web address]  [facility]  Office  [phone]  CC: Primary care physician; Patient, No Pcp Per

## 2016-11-05 NOTE — Consult Note (Signed)
Patient currently getting cardiac echo and unavailable for full consultation. Given patient's history of DVT and protein C deficiency as well as pulmonary embolism in the setting of a irretrievable IVC filter, would recommend lifelong anticoagulation at this point. Previously given his history of noncompliance and cost, DOACs were not prescribed. Recommend discussing with case management. Patient may require treatment with Coumadin. Full consult to follow. Patient should also follow-up with his primary hematologist, Dr. Donneta RombergBrahmanday upon discharge.

## 2016-11-05 NOTE — Progress Notes (Addendum)
ANTICOAGULATION CONSULT NOTE - Initial Consult  Pharmacy Consult for heparin for PE Indication: pulmonary embolus  Allergies  Allergen Reactions  . Percocet [Oxycodone-Acetaminophen] Nausea And Vomiting    Patient Measurements: Height: 5\' 11"  (180.3 cm) Weight: 172 lb 1.6 oz (78.1 kg) IBW/kg (Calculated) : 75.3 Heparin Dosing Weight: 72.6kg   Vital Signs: Temp: 98.5 F (36.9 C) (08/11 2312) Temp Source: Oral (08/11 2312) BP: 122/61 (08/11 2012) Pulse Rate: 60 (08/11 2012)  Labs:  Recent Labs  11/04/16 1447 11/04/16 1448 11/04/16 1712 11/04/16 2126  HGB 12.8*  --   --   --   HCT 39.6*  --   --   --   PLT 241  --   --   --   APTT  --  33  --   --   LABPROT  --  14.1  --   --   INR  --  1.09  --   --   CREATININE 0.82  --   --   --   TROPONINI 0.03*  --  <0.03 <0.03    Estimated Creatinine Clearance: 146.7 mL/min (by C-G formula based on SCr of 0.82 mg/dL).   Medical History: Past Medical History:  Diagnosis Date  . DVT (deep venous thrombosis) (HCC)   . DVT (deep venous thrombosis) (HCC) 05/31/2015  . DVT of lower extremity (deep venous thrombosis) (HCC)    DVT  . 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 levETIRAcetam  500 mg Oral BID  . nicotine  14 mg Transdermal Daily   Infusions:  . heparin 16 Units/kg/hr (11/04/16 1722)   PRN:   Assessment: 26 y/o male presents to ED with PE, to be started on heparin gtt per protocol.    Goal of Therapy:  Heparin level 0.3-0.7 units/ml Monitor platelets by anticoagulation protocol: Yes   Plan:  Give 3600 units bolus x 1 Start heparin infusion at 1150 units/hr Check anti-Xa level in 6 hours and daily while on heparin Continue to monitor H&H and platelets    8/11 2330 heparin level refused by patient. Will reorder with AM labs.  8/12 AM heparin level 0.39. Continue current regimen. Recheck ordered in 6 hours for confirmation.   Taaliyah Delpriore S 11/05/2016,1:24 AM

## 2016-11-05 NOTE — Progress Notes (Signed)
*  PRELIMINARY RESULTS* Echocardiogram 2D Echocardiogram has been performed.  Garrel Ridgelikeshia S Francisco Ostrovsky 11/05/2016, 11:20 AM

## 2016-11-06 LAB — CBC
HCT: 42.2 % (ref 40.0–52.0)
Hemoglobin: 13.4 g/dL (ref 13.0–18.0)
MCH: 23 pg — AB (ref 26.0–34.0)
MCHC: 31.6 g/dL — AB (ref 32.0–36.0)
MCV: 72.8 fL — ABNORMAL LOW (ref 80.0–100.0)
PLATELETS: 240 10*3/uL (ref 150–440)
RBC: 5.8 MIL/uL (ref 4.40–5.90)
RDW: 14.2 % (ref 11.5–14.5)
WBC: 17.2 10*3/uL — ABNORMAL HIGH (ref 3.8–10.6)

## 2016-11-06 MED ORDER — HYDROCODONE-ACETAMINOPHEN 5-325 MG PO TABS: 1.0000 | ORAL_TABLET | Freq: Four times a day (QID) | ORAL | 0 refills | Status: DC | PRN

## 2016-11-06 MED ORDER — APIXABAN 5 MG PO TABS: 5.0000 mg | ORAL_TABLET | Freq: Two times a day (BID) | ORAL | 0 refills | Status: DC

## 2016-11-06 NOTE — Discharge Instructions (Signed)
Resume diet and activity as before ° ° °

## 2016-11-06 NOTE — Progress Notes (Addendum)
ANTICOAGULATION CONSULT NOTE - Consult  Pharmacy Consult for Apixaban Dosing  Indication: pulmonary embolus  Allergies  Allergen Reactions  . Percocet [Oxycodone-Acetaminophen] Nausea And Vomiting    Patient Measurements: Height: 5\' 11"  (180.3 cm) Weight: 172 lb 1.6 oz (78.1 kg) IBW/kg (Calculated) : 75.3  Vital Signs: Temp: 98.4 F (36.9 C) (08/13 0539) Temp Source: Oral (08/13 0539) BP: 111/59 (08/13 0539) Pulse Rate: 73 (08/13 0539)  Labs:  Recent Labs  11/04/16 1447 11/04/16 1448 11/04/16 1712 11/04/16 2126 11/05/16 0435 11/06/16 0532  HGB 12.8*  --   --   --  13.4 13.4  HCT 39.6*  --   --   --  41.8 42.2  PLT 241  --   --   --  216 240  APTT  --  33  --   --   --   --   LABPROT  --  14.1  --   --   --   --   INR  --  1.09  --   --   --   --   HEPARINUNFRC  --   --   --   --  0.39  --   CREATININE 0.82  --   --   --   --   --   TROPONINI 0.03*  --  <0.03 <0.03 <0.03  --     Estimated Creatinine Clearance: 146.7 mL/min (by C-G formula based on SCr of 0.82 mg/dL).   Medical History: Past Medical History:  Diagnosis Date  . DVT (deep venous thrombosis) (HCC)   . DVT (deep venous thrombosis) (HCC) 05/31/2015  . DVT of lower extremity (deep venous thrombosis) (HCC)    DVT  . 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 Prior to apixaban (started 8/12), patient was receiving treatment dose heparin inpatient.   Plan:  Patient counseling completed 8/13 Continue patient on apixaban 10 mg twice daily x 7 days, followed by apixaban 5 mg twice daily.   Cleopatra CedarStephanie Myalee Stengel  Pharmacy Resident  11/06/2016,12:12 PM

## 2016-11-06 NOTE — Progress Notes (Signed)
Discharge information reviewed with patient who verbalized understanding. Prescriptions attached and explained how to take. Application for med manegament & Eliquis coupon given by CM, Nan. Patient is stable and ready for discharge. Patient's friend to transport home.

## 2016-11-06 NOTE — Care Management (Signed)
CM was picking up [facility] at [facility] for another patient at 4:15 p and informed this patient had not picked up his Eliquis.  CM obtained the med and took it to [facility].  CM called patient and the number was actually patient's grandfather. Spoke with patient's grandfather regarding patient not having his med and to [facility]. CM will return this to [facility] if patient does not pick it up within 24 hours

## 2016-11-06 NOTE — Care Management (Signed)
CM spoke with patient regarding the seriousness of current medical condition and the need to be fully compliant with anticoagulation treatment.  Provided patient with [facility] applications.  [facility] in stock.  Provided patient an additional script to be used with his 30 day coupon.  also provided with [facility]. Patient verbalizes understanding to proceed to [facility] to obtain his Eliquis and importance of completing the application process for the clinics
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
## 2016-11-13 ENCOUNTER — Inpatient Hospital Stay: Payer: Self-pay | Admitting: Internal Medicine
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
## 2016-12-26 ENCOUNTER — Other Ambulatory Visit (HOSPITAL_COMMUNITY): Payer: Self-pay | Admitting: Student

## 2016-12-26 DIAGNOSIS — S065X9A Traumatic subdural hemorrhage with loss of consciousness of unspecified duration, initial encounter: Secondary | ICD-10-CM

## 2016-12-26 DIAGNOSIS — S065XAA Traumatic subdural hemorrhage with loss of consciousness status unknown, initial encounter: Secondary | ICD-10-CM

## 2017-02-06 ENCOUNTER — Ambulatory Visit (HOSPITAL_COMMUNITY): Admission: RE | Admit: 2017-02-06 | Payer: Self-pay | Source: Ambulatory Visit

## 2017-02-07 ENCOUNTER — Telehealth: Payer: Self-pay | Admitting: Pharmacy Technician

## 2017-02-07 NOTE — Telephone Encounter (Signed)
[facility] filled initial prescription.  Patient was given new patient packet.  Patient never returned information or scheduled and eligibility appointment.  [facility] unable to provide additional medication assistance until eligibility is determined.  Benett Swoyer J. Diamon Reddinger Care Manager [facility] 

## 2017-10-15 ENCOUNTER — Emergency Department
Admission: EM | Admit: 2017-10-15 | Discharge: 2017-10-15 | Disposition: A | Discharge status: Home / Self Care | Payer: Self-pay | Attending: Emergency Medicine | Admitting: Emergency Medicine

## 2017-10-15 ENCOUNTER — Encounter: Payer: Self-pay | Admitting: Emergency Medicine

## 2017-10-15 DIAGNOSIS — Z79899 Other long term (current) drug therapy: Secondary | ICD-10-CM | POA: Insufficient documentation

## 2017-10-15 DIAGNOSIS — Z86711 Personal history of pulmonary embolism: Secondary | ICD-10-CM | POA: Insufficient documentation

## 2017-10-15 DIAGNOSIS — L0231 Cutaneous abscess of buttock: Secondary | ICD-10-CM | POA: Insufficient documentation

## 2017-10-15 DIAGNOSIS — Z7901 Long term (current) use of anticoagulants: Secondary | ICD-10-CM | POA: Insufficient documentation

## 2017-10-15 DIAGNOSIS — F17210 Nicotine dependence, cigarettes, uncomplicated: Secondary | ICD-10-CM | POA: Insufficient documentation

## 2017-10-15 MED ORDER — HYDROCODONE-ACETAMINOPHEN 5-325 MG PO TABS: 1.0000 | ORAL_TABLET | Freq: Four times a day (QID) | ORAL | 0 refills | Status: DC | PRN

## 2017-10-15 MED ORDER — HYDROCODONE-ACETAMINOPHEN 5-325 MG PO TABS: 1.0000 | ORAL_TABLET | ORAL | 0 refills | Status: DC | PRN

## 2017-10-15 MED ORDER — SULFAMETHOXAZOLE-TRIMETHOPRIM 800-160 MG PO TABS: 1.0000 | ORAL_TABLET | Freq: Two times a day (BID) | ORAL | 0 refills | Status: DC

## 2017-10-15 NOTE — ED Triage Notes (Signed)
Patient presents to the ED with a red raised painful area to his left lower but cheek.  Patient is in no obvious distress at this time.  Patient reports history of the same.

## 2017-10-15 NOTE — Discharge Instructions (Addendum)
Begin taking antibiotics as directed.  Bactrim DS twice daily for 10 days.  Also there is a prescription for pain medication.  Do not drive or operate machinery while taking the pain medication.  Continue sitz bath or warm compresses to the area frequently.  If any worsening you may need to see a surgeon or return to the emergency department.
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
## 2017-12-27 ENCOUNTER — Other Ambulatory Visit: Payer: Self-pay

## 2017-12-27 ENCOUNTER — Encounter: Payer: Self-pay | Admitting: Emergency Medicine

## 2017-12-27 ENCOUNTER — Emergency Department
Admission: EM | Admit: 2017-12-27 | Discharge: 2017-12-27 | Disposition: A | Discharge status: Home / Self Care | Payer: Self-pay | Attending: Emergency Medicine | Admitting: Emergency Medicine

## 2017-12-27 DIAGNOSIS — F17210 Nicotine dependence, cigarettes, uncomplicated: Secondary | ICD-10-CM | POA: Insufficient documentation

## 2017-12-27 DIAGNOSIS — Z86711 Personal history of pulmonary embolism: Secondary | ICD-10-CM | POA: Insufficient documentation

## 2017-12-27 DIAGNOSIS — Z86718 Personal history of other venous thrombosis and embolism: Secondary | ICD-10-CM | POA: Insufficient documentation

## 2017-12-27 DIAGNOSIS — Z7901 Long term (current) use of anticoagulants: Secondary | ICD-10-CM | POA: Insufficient documentation

## 2017-12-27 DIAGNOSIS — Z79899 Other long term (current) drug therapy: Secondary | ICD-10-CM | POA: Insufficient documentation

## 2017-12-27 DIAGNOSIS — L02211 Cutaneous abscess of abdominal wall: Secondary | ICD-10-CM | POA: Insufficient documentation

## 2017-12-27 DIAGNOSIS — L0291 Cutaneous abscess, unspecified: Secondary | ICD-10-CM

## 2017-12-27 MED ORDER — SULFAMETHOXAZOLE-TRIMETHOPRIM 800-160 MG PO TABS: 1.0000 | ORAL_TABLET | Freq: Two times a day (BID) | ORAL | 0 refills | Status: AC
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
## 2017-12-27 NOTE — ED Triage Notes (Signed)
Pt presents to ED with insect bite on stomach x 5-6 days.

## 2017-12-27 NOTE — ED Notes (Signed)
Pt felt a sting 6  Days ago while sleeping  - noticed a  Lesion forming getting bigger started  Draining 2 days  Ago   Area  Is on lower abd

## 2018-05-24 ENCOUNTER — Emergency Department: Payer: No Typology Code available for payment source

## 2018-05-24 ENCOUNTER — Other Ambulatory Visit: Payer: Self-pay

## 2018-05-24 ENCOUNTER — Emergency Department
Admission: EM | Admit: 2018-05-24 | Discharge: 2018-05-24 | Discharge status: Against Medical Advice | Payer: No Typology Code available for payment source | Attending: Emergency Medicine | Admitting: Emergency Medicine

## 2018-05-24 DIAGNOSIS — Y9389 Activity, other specified: Secondary | ICD-10-CM | POA: Diagnosis not present

## 2018-05-24 DIAGNOSIS — F17210 Nicotine dependence, cigarettes, uncomplicated: Secondary | ICD-10-CM | POA: Diagnosis not present

## 2018-05-24 DIAGNOSIS — R040 Epistaxis: Secondary | ICD-10-CM | POA: Diagnosis not present

## 2018-05-24 DIAGNOSIS — S0232XA Fracture of orbital floor, left side, initial encounter for closed fracture: Secondary | ICD-10-CM | POA: Diagnosis not present

## 2018-05-24 DIAGNOSIS — S0240DA Maxillary fracture, left side, initial encounter for closed fracture: Secondary | ICD-10-CM | POA: Diagnosis not present

## 2018-05-24 DIAGNOSIS — Y92410 Unspecified street and highway as the place of occurrence of the external cause: Secondary | ICD-10-CM | POA: Diagnosis not present

## 2018-05-24 DIAGNOSIS — S0990XA Unspecified injury of head, initial encounter: Secondary | ICD-10-CM | POA: Diagnosis present

## 2018-05-24 DIAGNOSIS — Z79899 Other long term (current) drug therapy: Secondary | ICD-10-CM | POA: Diagnosis not present

## 2018-05-24 DIAGNOSIS — S022XXA Fracture of nasal bones, initial encounter for closed fracture: Secondary | ICD-10-CM

## 2018-05-24 DIAGNOSIS — Y999 Unspecified external cause status: Secondary | ICD-10-CM | POA: Insufficient documentation

## 2018-05-24 MED ORDER — TETANUS-DIPHTH-ACELL PERTUSSIS 5-2.5-18.5 LF-MCG/0.5 IM SUSP
0.5000 mL | Freq: Once | INTRAMUSCULAR | Status: AC
  Filled 2018-05-24: qty 0.5

## 2018-05-24 MED ORDER — AMOXICILLIN 875 MG PO TABS: 875.0000 mg | ORAL_TABLET | Freq: Two times a day (BID) | ORAL | 0 refills | Status: AC
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
## 2018-05-24 NOTE — ED Notes (Signed)
Pt left AMA. This RN went over discharge paperwork and medications with pt father.
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
## 2018-05-24 NOTE — ED Notes (Signed)
Patient gives verbal consent for this RN to draw forsenic blood for [facility]

## 2018-05-24 NOTE — ED Notes (Signed)
[facility]  -  Left eye     20/25                           Right eye  20/25
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
## 2018-05-24 NOTE — ED Notes (Signed)
Specimens obtained from right antecub after cleansed with betadine.  Specimens x 2 given to [employer].

## 2018-05-24 NOTE — Discharge Instructions (Signed)
Your CT scans today show multiple facial fractures.  You should take amoxicillin as prescribed, two times a day, for the next 10 days to prevent infection in your sinus from the blood collection there.  You should follow up with ENT in 1 week for further evaluation of your facial fractures.  You should follow up with Vascular Surgery to see if your IVC filter can be removed.   You should follow up with hematology (also called medical oncology) for further evaluation of your protein C clotting disorder and assessment of need for blood thinners like Xarelto.

## 2018-05-31 ENCOUNTER — Inpatient Hospital Stay: Payer: Self-pay | Attending: Internal Medicine | Admitting: Internal Medicine

## 2018-05-31 ENCOUNTER — Encounter: Payer: Self-pay | Admitting: Internal Medicine

## 2018-05-31 VITALS — BP 110/62 | HR 46 | Temp 97.6°F | Resp 16 | Wt 164.2 lb

## 2018-05-31 DIAGNOSIS — I2699 Other pulmonary embolism without acute cor pulmonale: Secondary | ICD-10-CM

## 2018-05-31 DIAGNOSIS — Z86711 Personal history of pulmonary embolism: Secondary | ICD-10-CM

## 2018-05-31 DIAGNOSIS — Z7901 Long term (current) use of anticoagulants: Secondary | ICD-10-CM

## 2018-05-31 NOTE — Progress Notes (Signed)
Patient came out of the came room in abruptly without finishing apt with Dr. Donneta Romberg. pt walked down the hallway loudly cursing words of profanity. pt appeared agitated. Then proceeded to walk back to pod 3 nursing station to request a referral to [facility]. He stated that he "only wants someone to order an ultrasound. I don't want to have my care here any longer."  Pt also provided with the address/directions and phone number of [facility] per his request.

## 2018-05-31 NOTE — Assessment & Plan Note (Signed)
#   #   #   DISPOSITION: # No follow up me.  # refer to [facility] hematology in [place]- re: hx of DVT/PE- Dr.B
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
## 2018-06-04 ENCOUNTER — Encounter (INDEPENDENT_AMBULATORY_CARE_PROVIDER_SITE_OTHER): Payer: Self-pay | Admitting: Vascular Surgery

## 2018-06-09 ENCOUNTER — Emergency Department
Admission: EM | Admit: 2018-06-09 | Discharge: 2018-06-09 | Disposition: A | Discharge status: Home / Self Care | Payer: Self-pay | Attending: Emergency Medicine | Admitting: Emergency Medicine

## 2018-06-09 ENCOUNTER — Emergency Department: Payer: Self-pay

## 2018-06-09 ENCOUNTER — Other Ambulatory Visit: Payer: Self-pay

## 2018-06-09 ENCOUNTER — Encounter: Payer: Self-pay | Admitting: Emergency Medicine

## 2018-06-09 DIAGNOSIS — S63269A Dislocation of metacarpophalangeal joint of unspecified finger, initial encounter: Secondary | ICD-10-CM

## 2018-06-09 DIAGNOSIS — Y929 Unspecified place or not applicable: Secondary | ICD-10-CM | POA: Insufficient documentation

## 2018-06-09 DIAGNOSIS — Y999 Unspecified external cause status: Secondary | ICD-10-CM | POA: Insufficient documentation

## 2018-06-09 DIAGNOSIS — Y939 Activity, unspecified: Secondary | ICD-10-CM | POA: Insufficient documentation

## 2018-06-09 DIAGNOSIS — W2209XA Striking against other stationary object, initial encounter: Secondary | ICD-10-CM | POA: Insufficient documentation

## 2018-06-09 DIAGNOSIS — S63054A Dislocation of other carpometacarpal joint of right hand, initial encounter: Secondary | ICD-10-CM | POA: Insufficient documentation

## 2018-06-09 MED ORDER — OXYCODONE-ACETAMINOPHEN 5-325 MG PO TABS: 1.0000 | ORAL_TABLET | Freq: Three times a day (TID) | ORAL | 0 refills | Status: DC | PRN

## 2018-06-09 MED ORDER — PROPOFOL 10 MG/ML IV BOLUS
0.5000 mg/kg | Freq: Once | INTRAVENOUS | Status: AC
Start: 2018-06-09 — End: 2018-06-09
  Administered 2018-06-09: 38.4 mg via INTRAVENOUS

## 2018-06-09 MED ORDER — PROPOFOL 10 MG/ML IV BOLUS
1.0000 mg/kg | Freq: Once | INTRAVENOUS | Status: AC
  Administered 2018-06-09: 76.7 mg via INTRAVENOUS
  Filled 2018-06-09: qty 20

## 2018-06-09 NOTE — ED Notes (Signed)
Given drink and crackers-visitor at bedside encouraging patient to eat and drink. Pt sleeping but awakens easily to voice.

## 2018-06-09 NOTE — Sedation Documentation (Addendum)
Dr. Daryel November

## 2018-06-09 NOTE — Sedation Documentation (Signed)
Pt requiring more sedation.

## 2018-06-09 NOTE — ED Provider Notes (Signed)
[facility] Emergency Department Provider Note       Time seen: ----------------------------------------- 9:48 AM on 06/09/2018 -----------------------------------------   I have reviewed the triage vital signs and the nursing notes.  HISTORY   Chief Complaint Hand Injury  HPI Patient brought to the main ER after initial x-rays revealed dislocation of the third, fourth and fifth carpometacarpal joints.  She was seen and examined by me, discussed the risk and benefits of procedural sedation.  Patient consented to the procedure.  I performed both sedation and reduction.  .Sedation Date/Time: 06/09/2018 9:50 AM Performed by: Emily Filbert, MD Authorized by: Emily Filbert, MD   Consent:    Consent obtained:  Written   Consent given by:  Patient   Risks discussed:  Inadequate sedation, prolonged hypoxia resulting in organ damage and prolonged sedation necessitating reversal   Alternatives discussed:  Analgesia without sedation, anxiolysis and regional anesthesia Universal protocol:    Procedure explained and questions answered to patient or proxy's satisfaction: yes     Relevant documents present and verified: yes     Test results available and properly labeled: yes     Imaging studies available: yes     Required blood products, implants, devices, and special equipment available: yes     Site/side marked: yes     Immediately prior to procedure a time out was called: yes     Patient identity confirmation method:  Verbally with patient Indications:    Procedure necessitating sedation performed by:  Physician performing sedation Pre-sedation assessment:    Time since last food or drink:  2 hours since he drank   ASA classification: class 2 - patient with mild systemic disease     Neck mobility: normal     Mouth opening:  3 or more finger widths   Thyromental distance:  4 finger widths   Mallampati score:  III - soft palate, base of uvula  visible   Pre-sedation assessments completed and reviewed: airway patency, cardiovascular function, hydration status, mental status, nausea/vomiting, pain level, respiratory function and temperature   Immediate pre-procedure details:    Reassessment: Patient reassessed immediately prior to procedure     Reviewed: vital signs, relevant labs/tests and NPO status     Verified: bag valve mask available, emergency equipment available, intubation equipment available, IV patency confirmed, oxygen available and suction available   Procedure details (see MAR for exact dosages):    Preoxygenation:  Nasal cannula   Sedation:  Propofol   Intra-procedure monitoring:  Blood pressure monitoring, cardiac monitor, continuous pulse oximetry, frequent LOC assessments, frequent vital sign checks and continuous capnometry   Intra-procedure events: none     Total Provider sedation time (minutes):  10 Post-procedure details:    Attendance: Constant attendance by certified staff until patient recovered     Recovery: Patient returned to pre-procedure baseline     Post-sedation assessments completed and reviewed: airway patency, cardiovascular function, hydration status, mental status, nausea/vomiting, pain level, respiratory function and temperature     Patient is stable for discharge or admission: yes     Patient tolerance:  Tolerated well, no immediate complications Reduction of dislocation Date/Time: 06/09/2018 9:51 AM Performed by: Emily Filbert, MD Authorized by: Emily Filbert, MD  Consent: Verbal consent obtained. Written consent obtained. Consent given by: patient Patient understanding: patient states understanding of the procedure being performed Procedure consent: procedure consent matches procedure scheduled Relevant documents: relevant documents present and verified Test results: test results available and properly labeled  Imaging studies: imaging studies available Patient identity  confirmed: verbally with patient and arm band Time out: Immediately prior to procedure a "time out" was called to verify the correct patient, procedure, equipment, support staff and site/side marked as required. Local anesthesia used: no  Anesthesia: Local anesthesia used: no Patient tolerance: Patient tolerated the procedure well with no immediate complications Comments: Patient with successful reduction of carpometacarpal dislocations.  Right hand was placed in finger traps with some downward pressure applied on the upper arm with the arm bent at 90 degrees at the elbow.  The metacarpal dislocations spontaneously reduced.  He was placed in a sugar tong splint.  Patient tolerated this well.   Radiology: IMPRESSION: Relocation of 3rd, 4th and 5th carpometacarpal joints. Tiny dorsal fracture fragment of uncertain origin.  Assessment: Dislocation of the third, fourth and fifth metacarpals status post reduction  Plan: Patient will remain in a sugar tong splint.  I will prescribe pain medicine and referred to orthopedics for outpatient follow-up.    Emily Filbert, MD 06/09/18 1016
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
## 2018-06-09 NOTE — ED Notes (Signed)
ED Provider at bedside. 

## 2018-06-09 NOTE — ED Notes (Signed)
Pt able to eat and drink without emesis. Left with all of belongings. Verbalizes split care, followup and prescriptions. Left with moderate sedation discharge papers. Pt not driving. Ambulates safely. Pt alert and oriented X4, active, cooperative, pt in NAD. RR even and unlabored, color WNL.

## 2018-06-09 NOTE — ED Notes (Signed)
Sugar tong applied by Gerilyn Pilgrim, EDT. Reviewed by Dr. Mayford Knife at bedside. Xray at bedside.

## 2018-06-09 NOTE — Sedation Documentation (Signed)
Tia Masker, PA and Gerilyn Pilgrim ED Tech at bedside.

## 2018-06-09 NOTE — ED Notes (Signed)
Consent and moderate sedation instructions signed by patient at this time.

## 2018-06-09 NOTE — ED Notes (Signed)
X-ray at bedside

## 2018-06-09 NOTE — ED Notes (Signed)
Propofol waste witnessed by Marylene Land, RN 84.9mg 

## 2018-06-09 NOTE — ED Notes (Signed)
Continued to encourage patient to attempt to eat/drink. Pt easily aroused by this RN verbal stimuli.
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
## 2018-06-24 ENCOUNTER — Encounter (INDEPENDENT_AMBULATORY_CARE_PROVIDER_SITE_OTHER): Payer: Self-pay | Admitting: Vascular Surgery

## 2018-06-25 ENCOUNTER — Encounter: Payer: Self-pay | Admitting: Oncology

## 2018-11-06 ENCOUNTER — Encounter: Payer: Self-pay | Admitting: Emergency Medicine

## 2018-11-06 ENCOUNTER — Emergency Department: Payer: Self-pay

## 2018-11-06 ENCOUNTER — Emergency Department
Admission: EM | Admit: 2018-11-06 | Discharge: 2018-11-06 | Disposition: A | Discharge status: Home / Self Care | Payer: Self-pay | Attending: Emergency Medicine | Admitting: Emergency Medicine

## 2018-11-06 ENCOUNTER — Other Ambulatory Visit: Payer: Self-pay

## 2018-11-06 DIAGNOSIS — S92325A Nondisplaced fracture of second metatarsal bone, left foot, initial encounter for closed fracture: Secondary | ICD-10-CM | POA: Insufficient documentation

## 2018-11-06 DIAGNOSIS — F17210 Nicotine dependence, cigarettes, uncomplicated: Secondary | ICD-10-CM | POA: Insufficient documentation

## 2018-11-06 DIAGNOSIS — Y9389 Activity, other specified: Secondary | ICD-10-CM | POA: Insufficient documentation

## 2018-11-06 DIAGNOSIS — Y929 Unspecified place or not applicable: Secondary | ICD-10-CM | POA: Insufficient documentation

## 2018-11-06 DIAGNOSIS — Y999 Unspecified external cause status: Secondary | ICD-10-CM | POA: Insufficient documentation

## 2018-11-06 DIAGNOSIS — S92335A Nondisplaced fracture of third metatarsal bone, left foot, initial encounter for closed fracture: Secondary | ICD-10-CM | POA: Insufficient documentation

## 2018-11-06 DIAGNOSIS — S40012A Contusion of left shoulder, initial encounter: Secondary | ICD-10-CM | POA: Insufficient documentation

## 2018-11-06 MED ORDER — IBUPROFEN 800 MG PO TABS: 800.0000 mg | ORAL_TABLET | Freq: Three times a day (TID) | ORAL | 0 refills | Status: DC | PRN

## 2018-11-06 MED ORDER — OXYCODONE-ACETAMINOPHEN 5-325 MG PO TABS: 1.0000 | ORAL_TABLET | ORAL | 0 refills | Status: DC | PRN

## 2018-11-06 MED ORDER — IBUPROFEN 800 MG PO TABS
800.0000 mg | ORAL_TABLET | Freq: Once | ORAL | Status: AC
  Administered 2018-11-06: 800 mg via ORAL
  Filled 2018-11-06: qty 1

## 2018-11-06 MED ORDER — OXYCODONE-ACETAMINOPHEN 5-325 MG PO TABS
1.0000 | ORAL_TABLET | Freq: Once | ORAL | Status: AC
  Administered 2018-11-06: 1 via ORAL
  Filled 2018-11-06: qty 1
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
## 2018-11-06 NOTE — Discharge Instructions (Addendum)
1.  You may take pain medicines as needed (Motrin/Percocet) 2.  Keep splint clean and dry.  Use crutches to help you walk 3.  Do not put weight on your left foot 4.  Elevate affected area several times daily and apply ice to reduce swelling 5.  Wear sling as needed for comfort 6.  Return to the ER for worsening symptoms, persistent vomiting, difficulty breathing or other concerns

## 2018-11-12 ENCOUNTER — Other Ambulatory Visit: Payer: Self-pay

## 2018-11-12 ENCOUNTER — Other Ambulatory Visit: Payer: Self-pay | Admitting: Podiatry

## 2018-11-12 ENCOUNTER — Other Ambulatory Visit
Admission: RE | Admit: 2018-11-12 | Discharge: 2018-11-12 | Disposition: A | Discharge status: Home / Self Care | Payer: HRSA Program | Source: Ambulatory Visit | Attending: Podiatry | Admitting: Podiatry

## 2018-11-12 ENCOUNTER — Encounter: Payer: Self-pay | Admitting: Anesthesiology

## 2018-11-12 DIAGNOSIS — Z20828 Contact with and (suspected) exposure to other viral communicable diseases: Secondary | ICD-10-CM | POA: Insufficient documentation

## 2018-11-12 DIAGNOSIS — Z01812 Encounter for preprocedural laboratory examination: Secondary | ICD-10-CM | POA: Diagnosis present

## 2018-11-12 NOTE — Pre-Procedure Instructions (Signed)
Called Dr Andree Elk regarding medical history of PE.  Medical clearance requested.

## 2018-11-13 ENCOUNTER — Inpatient Hospital Stay
Admission: RE | Admit: 2018-11-13 | Discharge: 2018-11-13 | Disposition: A | Discharge status: Home / Self Care | Payer: Self-pay | Source: Ambulatory Visit

## 2018-11-13 LAB — SARS CORONAVIRUS 2 (TAT 6-24 HRS): SARS Coronavirus 2: NEGATIVE

## 2018-11-14 ENCOUNTER — Ambulatory Visit: Admission: RE | Admit: 2018-11-14 | Payer: Self-pay | Source: Home / Self Care

## 2018-11-14 ENCOUNTER — Encounter: Admission: RE | Payer: Self-pay | Source: Home / Self Care

## 2018-11-14 SURGERY — OPEN REDUCTION INTERNAL FIXATION (ORIF) METATARSAL (TOE) FRACTURE
Anesthesia: Choice | Laterality: Left

## 2018-11-18 ENCOUNTER — Ambulatory Visit: Payer: Self-pay | Admitting: Podiatry

## 2018-11-22 ENCOUNTER — Other Ambulatory Visit: Payer: Self-pay

## 2018-11-22 ENCOUNTER — Ambulatory Visit (INDEPENDENT_AMBULATORY_CARE_PROVIDER_SITE_OTHER): Payer: Self-pay | Admitting: Vascular Surgery

## 2018-11-22 ENCOUNTER — Encounter (INDEPENDENT_AMBULATORY_CARE_PROVIDER_SITE_OTHER): Payer: Self-pay | Admitting: Vascular Surgery

## 2018-11-22 VITALS — BP 149/90 | HR 59 | Resp 10 | Ht 71.0 in | Wt 161.0 lb

## 2018-11-22 DIAGNOSIS — D6859 Other primary thrombophilia: Secondary | ICD-10-CM

## 2018-11-22 DIAGNOSIS — Z95828 Presence of other vascular implants and grafts: Secondary | ICD-10-CM

## 2018-11-22 DIAGNOSIS — I824Y2 Acute embolism and thrombosis of unspecified deep veins of left proximal lower extremity: Secondary | ICD-10-CM

## 2018-11-22 DIAGNOSIS — Z720 Tobacco use: Secondary | ICD-10-CM

## 2018-11-22 NOTE — Assessment & Plan Note (Signed)
Multiple previous DVT as well as a pulmonary embolus.  No current evidence of DVT although he would be at high risk for recurrent DVT or PE around the time of his foot surgery.

## 2018-11-22 NOTE — Assessment & Plan Note (Signed)
The patient had a previous IVC filter placed 5 years ago and this remains in place.  He is currently not on anticoagulation with the filter in place with a known hypercoagulable condition which would make filter thrombosis a very high risk proposition or at least recurrent thrombotic issues related to the filter in place.  As such, consideration for an attempted IVC filter removal is reasonable.  This filter is been in for 5 years and it may not be able to be retrieved.  That was discussed with patient in detail today including drawings of why this may not be retrievable.  His recent foot fracture and need for foot surgery complicates the situation somewhat, and I would not remove his filter until after he has had his foot surgery.  He really needs to be on anticoagulation but that does not appear to be something he is willing to do at this point.  He saw hematology earlier this year and said he would not take anticoagulation.  That makes this a fairly tricky situation.  If we leave his filter in place and he is not on blood thinners, the risk of filter thrombosis is very high.  If we take his filter out, that would at least remove that risk but he would certainly be at risk for pulmonary embolus if he develops a DVT.  The general paradigm is if the patient is a hypercoagulable patient not on blood thinners the filter should be removed due to its risk of thrombosis.  That is what he would like to have.  This will need to wait until after his foot surgery and then if he would like to have an attempted IVC filter removal I will be happy to try.  The risks of that procedure include inability to remove the filter which I think is really high, bleeding, infection, nephrotoxicity from the contrast, and a small risk of damaging the filter with attempted removal.  The patient voices his understanding and we can contact him after his foot surgery.
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
## 2018-11-22 NOTE — Patient Instructions (Signed)
Inferior Vena Cava Filter Removal  Inferior vena cava filter removal is a procedure to take out a metal filter that was placed into a large vein in the abdomen (inferior vena cava, IVC). An IVC filter prevents blood clots in the legs or pelvis from traveling to the heart or lungs. Some IVC filters are designed to be removed (retrievable filters). You may have your filter removed when the danger of forming blood clots has passed or when you can take blood-thinning medicine to prevent blood clots. In some cases, the filter may need to be removed because it becomes damaged, is not working, or is causing problems. Most filters can be removed through the vein (percutaneous). In the rare cases when a surgeon is unable to remove the filter percutaneously, one of these steps may be taken:  A more invasive, open surgery may be necessary.  The filter may be left in place. Tell a health care provider about:  Any allergies you have.  All medicines you are taking, including vitamins, herbs, eye drops, creams, and over-the-counter medicines.  Any problems you or family members have had with anesthetic medicines or with contrast dyes that are used during an imaging test.  Any blood disorders you have.  Any surgeries you have had.  Any medical conditions you have.  Whether you are pregnant or may be pregnant. What are the risks? Generally, this is a safe procedure. However, problems may occur, including:  Infection.  Bleeding.  Allergic reactions to medicines or dyes.  Damage to the IVC, other blood vessels, or surrounding structures.  A blood clot or a piece of the filter breaking loose and traveling to the heart or lungs. What happens before the procedure? Medicines Ask your health care provider about:  Changing or stopping your regular medicines. This is especially important if you are taking diabetes medicines or blood thinners.  Taking medicines such as aspirin and ibuprofen. These  medicines can thin your blood. Do not take these medicines unless your health care provider tells you to take them.  Taking over-the-counter medicines, vitamins, herbs, and supplements. Staying hydrated Follow instructions from your health care provider about hydration, which may include:  Up to 2 hours before the procedure - you may continue to drink clear liquids, such as water or clear fruit juice. Eating and drinking restrictions Follow instructions from your health care provider about eating and drinking, which may include:  8 hours before the procedure - stop eating heavy meals or foods such as meat, fried foods, or fatty foods.  6 hours before the procedure - stop eating light meals or foods, such as toast or cereal.  6 hours before the procedure - stop drinking milk or drinks that contain milk.  2 hours before the procedure - stop drinking clear liquids. General instructions  Plan to have someone take you home from the hospital or clinic.  Plan to have a responsible adult care for you for at least 24 hours after you leave the hospital or clinic. This is important. What happens during the procedure?  To lower your risk of infection: ? Your health care team will wash or sanitize their hands. ? Hair may be removed from the surgical area. ? Your skin will be washed with soap.  An IV will be inserted into one of your veins.  You will be given one or more of the following: ? A medicine to help you relax (sedative). ? A medicine to make you fall asleep (general anesthetic).  The   procedure will be done through a vein in your groin or neck that leads to the IVC. Your health care provider will inject a numbing medicine (local anesthetic) into the skin over the vein that will be used.  A small incision will be made over the vein.  A long, thin tube (catheter) will be inserted into the vein.  The catheter will be moved through your vein and into your IVC. X-rays may be done to  help guide the catheter into place. Dye may be injected through the catheter before the X-rays to make the catheter and filter easier to see.  When the catheter reaches the filter, a hook (snare) on the end of the catheter may be used to latch onto the filter. In some cases, a grasping instrument (forceps) may be threaded through the catheter to gently grab and remove the filter instead.  After the filter has been hooked or grasped, the filter and instruments will be pulled out through the catheter.  The catheter will be removed through the incision in your skin.  Pressure will be placed over your incision until bleeding stops.  A bandage (dressing) will be placed over your incision. The procedure may vary among health care providers and hospitals. What happens after the procedure?  Your blood pressure, heart rate, breathing rate, and blood oxygen level will be monitored until the medicines you were given have worn off.  Do not drive for 24 hours if you were given a sedative during your procedure.  You may need to stay in bed (be on bed rest) for a period of time. Summary  Inferior vena cava (IVC) filter removal is a procedure to take out a filter that was placed to prevent blood clots from traveling to your heart or lungs.  You may have your filter removed when the danger of forming blood clots has passed or when you can take blood-thinning medicines to prevent blood clots. In some cases, a filter is removed because there is a problem with it.  The removal procedure is similar to the procedure that was used to insert the filter. A long, thin tube (catheter) will be inserted through a vein in your groin or neck. Then, the filter will be gently grasped and pulled out through the catheter.  Plan to have a responsible adult care for you for at least 24 hours after you leave the hospital or clinic. This is important. This information is not intended to replace advice given to you by your  health care provider. Make sure you discuss any questions you have with your health care provider. Document Released: 09/26/2016 Document Revised: 02/23/2017 Document Reviewed: 09/26/2016 Elsevier Patient Education  2020 Elsevier Inc.  

## 2018-11-22 NOTE — Assessment & Plan Note (Signed)
The patient has previous unprovoked DVTs and a hypercoagulable condition.  He should clearly be on anticoagulation and likely for life.  At this point he is not taking anticoagulation because he does not want to.

## 2018-11-22 NOTE — Assessment & Plan Note (Signed)
May increase clotting risk

## 2018-11-25 ENCOUNTER — Ambulatory Visit: Payer: Self-pay | Admitting: Podiatry

## 2018-11-25 ENCOUNTER — Other Ambulatory Visit: Payer: Self-pay

## 2018-12-03 ENCOUNTER — Encounter: Payer: Self-pay | Admitting: Podiatry

## 2018-12-03 ENCOUNTER — Other Ambulatory Visit: Payer: Self-pay

## 2018-12-03 ENCOUNTER — Ambulatory Visit (INDEPENDENT_AMBULATORY_CARE_PROVIDER_SITE_OTHER): Payer: Self-pay | Admitting: Podiatry

## 2018-12-03 ENCOUNTER — Ambulatory Visit: Payer: Self-pay

## 2018-12-03 DIAGNOSIS — S99922A Unspecified injury of left foot, initial encounter: Secondary | ICD-10-CM

## 2018-12-03 DIAGNOSIS — S92302A Fracture of unspecified metatarsal bone(s), left foot, initial encounter for closed fracture: Secondary | ICD-10-CM

## 2018-12-03 NOTE — Patient Instructions (Signed)
Pre-Operative Instructions  Congratulations, you have decided to take an important step towards improving your quality of life.  You can be assured that the doctors and staff at [facility] will be with you every step of the way.  Here are some important things you should know:  1. Plan to be at the surgery center/hospital at least 1 (one) hour prior to your scheduled time, unless otherwise directed by the surgical center/hospital staff.  You must have a responsible adult accompany you, remain during the surgery and drive you home.  Make sure you have directions to the surgical center/hospital to ensure you arrive on time. 2. If you are having surgery at [facility] or [facility] hospitals, you will need a copy of your medical history and physical form from your family physician within one month prior to the date of surgery. We will give you a form for your primary physician to complete.  3. We make every effort to accommodate the date you request for surgery.  However, there are times where surgery dates or times have to be moved.  We will contact you as soon as possible if a change in schedule is required.   4. No aspirin/ibuprofen for one week before surgery.  If you are on aspirin, any non-steroidal anti-inflammatory medications (Mobic, Aleve, Ibuprofen) should not be taken seven (7) days prior to your surgery.  You make take Tylenol for pain prior to surgery.  5. Medications - If you are taking daily heart and blood pressure medications, seizure, reflux, allergy, asthma, anxiety, pain or diabetes medications, make sure you notify the surgery center/hospital before the day of surgery so they can tell you which medications you should take or avoid the day of surgery. 6. No food or drink after midnight the night before surgery unless directed otherwise by surgical center/hospital staff. 7. No alcoholic beverages 24-hours prior to surgery.  No smoking 24-hours prior or 24-hours after  surgery. 8. Wear loose pants or shorts. They should be loose enough to fit over bandages, boots, and casts. 9. Don't wear slip-on shoes. Sneakers are preferred. 10. Bring your boot with you to the surgery center/hospital.  Also bring crutches or a walker if your physician has prescribed it for you.  If you do not have this equipment, it will be provided for you after surgery. 11. If you have not been contacted by the surgery center/hospital by the day before your surgery, call to confirm the date and time of your surgery. 12. Leave-time from work may vary depending on the type of surgery you have.  Appropriate arrangements should be made prior to surgery with your employer. 13. Prescriptions will be provided immediately following surgery by your doctor.  Fill these as soon as possible after surgery and take the medication as directed. Pain medications will not be refilled on weekends and must be approved by the doctor. 14. Remove nail polish on the operative foot and avoid getting pedicures prior to surgery. 15. Wash the night before surgery.  The night before surgery wash the foot and leg well with water and the antibacterial soap provided. Be sure to pay special attention to beneath the toenails and in between the toes.  Wash for at least three (3) minutes. Rinse thoroughly with water and dry well with a towel.  Perform this wash unless told not to do so by your physician.  Enclosed: 1 Ice pack (please put in freezer the night before surgery)   1 Hibiclens skin cleaner     Pre-op instructions  If you have any questions regarding the instructions, please do not hesitate to call our office.  [place]: [address], [place], [place] [address] -- [phone]  [place]: [address] [place], [place] [address] -- [phone]  [place]: [address]  [place], [place] [address] -- [phone]  [place]: [address], [address], [place], [place] [address] -- [phone]  Website:  [email or web address] 
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
## 2018-12-06 ENCOUNTER — Telehealth: Payer: Self-pay | Admitting: *Deleted

## 2018-12-06 NOTE — Telephone Encounter (Signed)
"  I need you to call me back as soon as possible."

## 2018-12-09 NOTE — Telephone Encounter (Signed)
"  I'd like to schedule my surgery."  Do you have a date that you would like?  "I'd like to do it as soon as possible."  What type of insurance do you have?  "I'm self pay."  I can see if I can get you scheduled for this Thursday or next Thursday, September 24.  "I'd rather do it this Thursday."  I'll try to get it scheduled.  If I have any problems, I'll let you know.  "What time?"  I can't give you an arrival time.  Someone from the surgical center will give you a call and will give you your arrival time.

## 2018-12-11 ENCOUNTER — Telehealth: Payer: Self-pay | Admitting: *Deleted

## 2018-12-11 NOTE — Telephone Encounter (Signed)
"  We need to cancel [employer].  He is self pay and his cost will be about $5000 for the facility fee.  He said he can't afford that."  I'll let Dr. Amalia Hailey know.

## 2019-01-02 ENCOUNTER — Other Ambulatory Visit: Payer: Self-pay

## 2019-01-02 ENCOUNTER — Emergency Department
Admission: EM | Admit: 2019-01-02 | Discharge: 2019-01-02 | Disposition: A | Discharge status: Home / Self Care | Payer: No Typology Code available for payment source | Attending: Emergency Medicine | Admitting: Emergency Medicine

## 2019-01-02 ENCOUNTER — Emergency Department: Payer: No Typology Code available for payment source

## 2019-01-02 DIAGNOSIS — Y9389 Activity, other specified: Secondary | ICD-10-CM | POA: Diagnosis not present

## 2019-01-02 DIAGNOSIS — K921 Melena: Secondary | ICD-10-CM | POA: Diagnosis not present

## 2019-01-02 DIAGNOSIS — I82890 Acute embolism and thrombosis of other specified veins: Secondary | ICD-10-CM | POA: Diagnosis not present

## 2019-01-02 DIAGNOSIS — Y92410 Unspecified street and highway as the place of occurrence of the external cause: Secondary | ICD-10-CM | POA: Diagnosis not present

## 2019-01-02 DIAGNOSIS — F17210 Nicotine dependence, cigarettes, uncomplicated: Secondary | ICD-10-CM | POA: Diagnosis not present

## 2019-01-02 DIAGNOSIS — M542 Cervicalgia: Secondary | ICD-10-CM | POA: Diagnosis not present

## 2019-01-02 DIAGNOSIS — Y999 Unspecified external cause status: Secondary | ICD-10-CM | POA: Diagnosis not present

## 2019-01-02 DIAGNOSIS — S3991XA Unspecified injury of abdomen, initial encounter: Secondary | ICD-10-CM | POA: Diagnosis present

## 2019-01-02 DIAGNOSIS — I8290 Acute embolism and thrombosis of unspecified vein: Secondary | ICD-10-CM

## 2019-01-02 DIAGNOSIS — M549 Dorsalgia, unspecified: Secondary | ICD-10-CM | POA: Diagnosis not present

## 2019-01-02 DIAGNOSIS — Z87820 Personal history of traumatic brain injury: Secondary | ICD-10-CM | POA: Insufficient documentation

## 2019-01-02 DIAGNOSIS — G44309 Post-traumatic headache, unspecified, not intractable: Secondary | ICD-10-CM | POA: Insufficient documentation

## 2019-01-02 LAB — CBC WITH DIFFERENTIAL/PLATELET
Abs Immature Granulocytes: 0.02 10*3/uL (ref 0.00–0.07)
Basophils Absolute: 0 10*3/uL (ref 0.0–0.1)
Basophils Relative: 0 %
Eosinophils Absolute: 0.1 10*3/uL (ref 0.0–0.5)
Eosinophils Relative: 1 %
HCT: 45.1 % (ref 39.0–52.0)
Hemoglobin: 14.1 g/dL (ref 13.0–17.0)
Immature Granulocytes: 0 %
Lymphocytes Relative: 22 %
Lymphs Abs: 2.2 10*3/uL (ref 0.7–4.0)
MCH: 23.6 pg — ABNORMAL LOW (ref 26.0–34.0)
MCHC: 31.3 g/dL (ref 30.0–36.0)
MCV: 75.5 fL — ABNORMAL LOW (ref 80.0–100.0)
Monocytes Absolute: 0.6 10*3/uL (ref 0.1–1.0)
Monocytes Relative: 6 %
Neutro Abs: 7.4 10*3/uL (ref 1.7–7.7)
Neutrophils Relative %: 71 %
Platelets: 259 10*3/uL (ref 150–400)
RBC: 5.97 MIL/uL — ABNORMAL HIGH (ref 4.22–5.81)
RDW: 14.8 % (ref 11.5–15.5)
WBC: 10.3 10*3/uL (ref 4.0–10.5)
nRBC: 0 % (ref 0.0–0.2)

## 2019-01-02 LAB — COMPREHENSIVE METABOLIC PANEL
ALT: 23 U/L (ref 0–44)
AST: 24 U/L (ref 15–41)
Albumin: 4.5 g/dL (ref 3.5–5.0)
Alkaline Phosphatase: 60 U/L (ref 38–126)
Anion gap: 9 (ref 5–15)
BUN: 13 mg/dL (ref 6–20)
CO2: 26 mmol/L (ref 22–32)
Calcium: 9.7 mg/dL (ref 8.9–10.3)
Chloride: 105 mmol/L (ref 98–111)
Creatinine, Ser: 0.87 mg/dL (ref 0.61–1.24)
GFR calc Af Amer: >60 mL/min (ref >60)
GFR calc non Af Amer: >60 mL/min (ref >60)
Glucose, Bld: 98 mg/dL (ref 70–99)
Potassium: 4.1 mmol/L (ref 3.5–5.1)
Sodium: 140 mmol/L (ref 135–145)
Total Bilirubin: 0.8 mg/dL (ref 0.3–1.2)
Total Protein: 7.5 g/dL (ref 6.5–8.1)

## 2019-01-02 MED ORDER — ELIQUIS 5 MG VTE STARTER PACK: ORAL_TABLET | ORAL | 0 refills | Status: DC

## 2019-01-02 MED ORDER — IOHEXOL 300 MG/ML  SOLN
100.0000 mL | Freq: Once | INTRAMUSCULAR | Status: AC | PRN
  Administered 2019-01-02: 100 mL via INTRAVENOUS
  Filled 2019-01-02: qty 100

## 2019-01-02 MED ORDER — ELIQUIS 5 MG VTE STARTER PACK: ORAL_TABLET | ORAL | 0 refills | Status: AC

## 2019-01-02 NOTE — Discharge Instructions (Addendum)
[email or web address] for discount on eliquis Or go to medication management  I have printed a prescription but I also sent a prescription to medication management.  Call them to see if they can help you get this medication for free.  Follow-up at the cancer center to get refills and additional anticoagulation.  You do have a blood clot in your gonadal vein.  This can go to your kidney and then to your heart.  Your IVC filter will not help with this problem.
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
## 2019-04-04 ENCOUNTER — Telehealth: Payer: Self-pay | Admitting: Pharmacy Technician

## 2019-04-04 NOTE — Telephone Encounter (Signed)
Patient failed to provide requested 2020 financial documentation. No additional medication assistance will be provided by [facility] without the required proof of income documentation. Patient notified by letter.    J[facility] CP[facility]  
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
